# Patient Record
Sex: Female | Born: 1945 | Race: White | Hispanic: No | Marital: Married | Smoking: Never smoker
Health system: Southern US, Community
[De-identification: ages and names within clinical notes are randomized; demographics above are authoritative.]

## PROBLEM LIST (undated history)

## (undated) DIAGNOSIS — J45909 Unspecified asthma, uncomplicated: Secondary | ICD-10-CM

## (undated) DIAGNOSIS — M1990 Unspecified osteoarthritis, unspecified site: Secondary | ICD-10-CM

## (undated) HISTORY — PX: ABDOMINAL HYSTERECTOMY: SHX81

## (undated) HISTORY — DX: Unspecified asthma, uncomplicated: J45.909

---

## 2014-03-24 ENCOUNTER — Emergency Department (HOSPITAL_BASED_OUTPATIENT_CLINIC_OR_DEPARTMENT_OTHER)
Admission: EM | Admit: 2014-03-24 | Discharge: 2014-03-24 | Disposition: A | Discharge status: Home / Self Care | Payer: Medicare Other | Attending: Emergency Medicine | Admitting: Emergency Medicine

## 2014-03-24 ENCOUNTER — Encounter (HOSPITAL_BASED_OUTPATIENT_CLINIC_OR_DEPARTMENT_OTHER): Payer: Self-pay | Admitting: Emergency Medicine

## 2014-03-24 DIAGNOSIS — L509 Urticaria, unspecified: Secondary | ICD-10-CM | POA: Insufficient documentation

## 2014-03-24 HISTORY — DX: Unspecified osteoarthritis, unspecified site: M19.90

## 2014-03-24 MED ORDER — FAMOTIDINE 20 MG PO TABS: ORAL_TABLET | ORAL | Status: DC

## 2014-03-24 MED ORDER — DIPHENHYDRAMINE HCL 25 MG PO TABS: 50.0000 mg | ORAL_TABLET | Freq: Four times a day (QID) | ORAL | Status: DC | PRN

## 2014-03-24 MED ORDER — DEXAMETHASONE SODIUM PHOSPHATE 10 MG/ML IJ SOLN
10.0000 mg | Freq: Once | INTRAMUSCULAR | Status: AC
  Administered 2014-03-24: 10 mg via INTRAVENOUS
  Filled 2014-03-24: qty 1

## 2014-03-24 MED ORDER — FAMOTIDINE IN NACL 20-0.9 MG/50ML-% IV SOLN
20.0000 mg | Freq: Once | INTRAVENOUS | Status: AC
  Administered 2014-03-24: 20 mg via INTRAVENOUS
  Filled 2014-03-24: qty 50

## 2014-03-24 NOTE — ED Notes (Signed)
Pt states she awoke with a "rash" around 0200. Pt denies any new meds, products, or foods. Denies any nausea at present. States she did have nausea earlier. Pt presents with general hives. Denies sob. resp even and unlabored. States she is itching all over.  Has taken 50mg  Benadryl at 0130.

## 2014-03-24 NOTE — ED Provider Notes (Signed)
CSN: [id number]     Arrival date & time 03/24/14  2820 History   First MD Initiated Contact with Patient 03/24/14 [phone]     Chief Complaint  Patient presents with  . Allergic Reaction     (Consider location/radiation/quality/duration/timing/severity/associated sxs/prior Treatment) HPI This is a 68 year old female who developed a generalized urticarial rash about 1:30 this morning. It was preceded by transient nausea but no vomiting. She took 50 mg of Benadryl about 15 minutes later with only equivocal improvement. She has had no throat swelling or shortness of breath. She is not aware of any potential trigger. She describes the rash as very itchy.  History reviewed. No pertinent past medical history. No past surgical history on file. No family history on file. History  Substance Use Topics  . Smoking status: Not on file  . Smokeless tobacco: Not on file  . Alcohol Use: Not on file   OB History   Grav Para Term Preterm Abortions TAB SAB Ect Mult Living                 Review of Systems  All other systems reviewed and are negative.   Allergies  Review of patient's allergies indicates not on file.  Home Medications   Prior to Admission medications   Not on File   BP 164/112  Pulse 94  Temp(Src) 97.4 F (36.3 C) (Oral)  Resp 20  Ht 5\' 1"  (1.549 m)  Wt 155 lb (70.308 kg)  BMI 29.30 kg/m2  SpO2 99%  Physical Exam General: Well-developed, well-nourished female in no acute distress; appearance consistent with age of record HENT: normocephalic; atraumatic; no dysphonia Eyes: pupils equal, round and reactive to light; extraocular muscles intact; lens implants Neck: supple Heart: regular rate and rhythm Lungs: clear to auscultation bilaterally Abdomen: soft; nondistended; nontender; no masses or hepatosplenomegaly; bowel sounds present Extremities: Arthritic changes, notably of the hands; no edema Neurologic: Awake, alert and oriented; motor function intact in all  extremities and symmetric; no facial droop Skin: Warm and dry; generalized urticaria with relative sparing of the face, scalp and lower leg Psychiatric: Normal mood and affect    ED Course  Procedures (including critical care time)  MDM  4:27 AM Symptoms almost completely resolved. Advised to continue Benaryl 50mg  every 6 hours and Pepcid 20mg  every 12 hours as needed for recurrence.    Hanley Seamen, MD 03/24/14 [phone]

## 2015-10-16 ENCOUNTER — Other Ambulatory Visit: Payer: Self-pay | Admitting: *Deleted

## 2015-10-16 ENCOUNTER — Ambulatory Visit (INDEPENDENT_AMBULATORY_CARE_PROVIDER_SITE_OTHER): Payer: Medicare Other | Admitting: Pediatrics

## 2015-10-16 ENCOUNTER — Encounter: Payer: Self-pay | Admitting: Pediatrics

## 2015-10-16 VITALS — BP 130/78 | HR 73 | Temp 97.9°F | Resp 18 | Ht 60.24 in | Wt 157.4 lb

## 2015-10-16 DIAGNOSIS — J22 Unspecified acute lower respiratory infection: Secondary | ICD-10-CM | POA: Insufficient documentation

## 2015-10-16 DIAGNOSIS — J4541 Moderate persistent asthma with (acute) exacerbation: Secondary | ICD-10-CM

## 2015-10-16 DIAGNOSIS — K219 Gastro-esophageal reflux disease without esophagitis: Secondary | ICD-10-CM | POA: Insufficient documentation

## 2015-10-16 DIAGNOSIS — J988 Other specified respiratory disorders: Secondary | ICD-10-CM | POA: Diagnosis not present

## 2015-10-16 DIAGNOSIS — J4530 Mild persistent asthma, uncomplicated: Secondary | ICD-10-CM | POA: Insufficient documentation

## 2015-10-16 MED ORDER — FLOVENT HFA 220 MCG/ACT IN AERO: INHALATION_SPRAY | RESPIRATORY_TRACT | Status: DC

## 2015-10-16 MED ORDER — AZITHROMYCIN 250 MG PO TABS: ORAL_TABLET | ORAL | Status: DC

## 2015-10-16 MED ORDER — IPRATROPIUM-ALBUTEROL 0.5-2.5 (3) MG/3ML IN SOLN
3.0000 mL | Freq: Once | RESPIRATORY_TRACT | Status: AC
  Administered 2015-10-16: 3 mL via RESPIRATORY_TRACT

## 2015-10-16 MED ORDER — ALBUTEROL SULFATE HFA 108 (90 BASE) MCG/ACT IN AERS: 2.0000 | INHALATION_SPRAY | RESPIRATORY_TRACT | Status: DC | PRN

## 2015-10-16 MED ORDER — FLUTICASONE PROPIONATE 50 MCG/ACT NA SUSP: 2.0000 | Freq: Every day | NASAL | Status: DC

## 2015-10-16 NOTE — Patient Instructions (Addendum)
Flovent 2 20-2 puffs twice a day for 2 weeks then decrease to 2 puffs once a day if you're cough or wheeze free Pro-air 2 puffs every 4 hours if needed for wheezing or coughing spells Zithromax 250 mg-2 tablets today, then one tablet once a day for the next 4 days Prednisone 20 mg twice a day for 3 days, 20 mg on the fourth day, 10 mg on the fifth day Call me if you're not doing well on this treatment plan Continue on her other medications

## 2015-10-16 NOTE — Progress Notes (Signed)
[address] [place] [address] Dept: [phone]  FOLLOW UP NOTE  Patient ID: Gina Estrada, female    DOB: [birth year]  Age: 69 y.o. MRN: [id number] Date of Office Visit: 10/16/2015  Assessment Chief Complaint: Cough; Asthma; and Shortness of Breath  HPI Nicollette Wilhelmi presents for evaluation of coughing spells for about 10 days. She has had nasal congestion and a postnasal drainage during this time. Her asthma had been well controlled  Current medications are Flovent 220 one puff once a day, cetirizine 10 mg once a day, pro-air 2 puffs every 4 hours if needed and Patanase 2 sprays per nostril once a day. Her other medications are outlined in the chart.   Drug Allergies:  Allergies  Allergen Reactions  . Penicillins   . Sulfa Antibiotics     Physical Exam: BP 130/78 mmHg  Pulse 73  Temp(Src) 97.9 F (36.6 C) (Oral)  Resp 18  Ht 5' 0.24" (1.53 m)  Wt 157 lb 6.5 oz (71.4 kg)  BMI 30.50 kg/m2   Physical Exam  Constitutional: She is oriented to person, place, and time. She appears well-developed and well-nourished.  HENT:  Eyes normal. Ears normal. Nose normal. Pharynx normal.  Neck: Neck supple.  Cardiovascular:  S1 and S2 normal no murmurs  Pulmonary/Chest:  Clear to percussion auscultation except for mild wheezing in both lungs  Lymphadenopathy:    She has no cervical adenopathy.  Neurological: She is alert and oriented to person, place, and time.  Psychiatric: She has a normal mood and affect. Her behavior is normal. Judgment and thought content normal.  Vitals reviewed.   Diagnostics:   FVC 1.57 L FEV1 1.37 L. Predicted FVC 2.54 L predicted flow 1.92 L-this shows a moderate reduction in the forced vital capacity  Assessment and Plan: 1. Asthma with acute exacerbation, moderate persistent   2. Gastroesophageal reflux disease without esophagitis   3. Lower respiratory tract infection     Meds ordered this encounter  Medications  .  ipratropium-albuterol (DUONEB) 0.5-2.5 (3) MG/3ML nebulizer solution 3 mL    Sig:   . FLOVENT HFA 220 MCG/ACT inhaler    Sig: 2 puffs twice a day 2 weeks then decrease to 2 puffs once daily for cough or wheeze.    Dispense:  1 Inhaler    Refill:  4  . albuterol (PROAIR HFA) 108 (90 Base) MCG/ACT inhaler    Sig: Inhale 2 puffs into the lungs every 4 (four) hours as needed.    Dispense:  1 Inhaler    Refill:  2  . azithromycin (ZITHROMAX) 250 MG tablet    Sig: Take 2 tablets today, then 1 tablet once a day for the next four days.    Dispense:  6 each    Refill:  0  . fluticasone (FLONASE) 50 MCG/ACT nasal spray    Sig: Place 2 sprays into both nostrils daily.    Dispense:  16 g    Refill:  5    Patient Instructions  Flovent 2 20-2 puffs twice a day for 2 weeks then decrease to 2 puffs once a day if you're cough or wheeze free Pro-air 2 puffs every 4 hours if needed for wheezing or coughing spells Zithromax 250 mg-2 tablets today, then one tablet once a day for the next 4 days Prednisone 20 mg twice a day for 3 days, 20 mg on the fourth day, 10 mg on the fifth day Call me if you're not doing well on this treatment plan  Continue on her other medications    Return in about 3 months (around 01/14/2016).    Thank you for the opportunity to care for this patient.  Please do not hesitate to contact me with questions.  Tonette BihariJ. A. Bardelas, M.D.  [facility] [address] [place], [place] [address] [phone]

## 2016-01-13 ENCOUNTER — Ambulatory Visit: Payer: Medicare Other | Admitting: Pediatrics

## 2017-03-30 ENCOUNTER — Encounter: Payer: Self-pay | Admitting: Allergy and Immunology

## 2017-03-30 ENCOUNTER — Ambulatory Visit (INDEPENDENT_AMBULATORY_CARE_PROVIDER_SITE_OTHER): Payer: Medicare HMO | Admitting: Allergy and Immunology

## 2017-03-30 VITALS — BP 124/62 | HR 82 | Temp 97.8°F | Resp 20 | Ht 61.0 in | Wt 163.0 lb

## 2017-03-30 DIAGNOSIS — K219 Gastro-esophageal reflux disease without esophagitis: Secondary | ICD-10-CM

## 2017-03-30 DIAGNOSIS — J45901 Unspecified asthma with (acute) exacerbation: Secondary | ICD-10-CM

## 2017-03-30 DIAGNOSIS — J3089 Other allergic rhinitis: Secondary | ICD-10-CM | POA: Diagnosis not present

## 2017-03-30 MED ORDER — ALBUTEROL SULFATE HFA 108 (90 BASE) MCG/ACT IN AERS: 2.0000 | INHALATION_SPRAY | RESPIRATORY_TRACT | 2 refills | Status: DC | PRN

## 2017-03-30 MED ORDER — RANITIDINE HCL 150 MG PO TABS: 150.0000 mg | ORAL_TABLET | Freq: Two times a day (BID) | ORAL | 3 refills | Status: DC

## 2017-03-30 MED ORDER — FLOVENT HFA 220 MCG/ACT IN AERO: 2.0000 | INHALATION_SPRAY | Freq: Two times a day (BID) | RESPIRATORY_TRACT | 5 refills | Status: DC

## 2017-03-30 MED ORDER — FLUTICASONE PROPIONATE 50 MCG/ACT NA SUSP: 2.0000 | Freq: Every day | NASAL | 5 refills | Status: DC | PRN

## 2017-03-30 NOTE — Progress Notes (Signed)
Follow-up Note  RE: Gina Estrada Gotcher MRN: [id number] DOB: 1946/05/08 Date of Office Visit: 03/30/2017  Primary care provider: Tarri FullerEscajeda, Richard, MD Referring provider: Tarri FullerEscajeda, Richard, MD  History of present illness: Gina Estrada Sisney is a 71 y.o. female with persistent asthma and allergic rhinitis presenting today for a sick visit.  She was last seen in this clinic in December 2016.  She reports that she ran out of Flovent and albuterol because his medications are too expensive.  She has recently been experiencing increased coughing, wheezing, and chest tightness.  She states that over the past 2 days she has been having a "nasty asthma attack" and has been up all night coughing.  She believes that the exacerbation may have been triggered on Saturday night when she had significant acid reflux.  She admits that she is only taking ranitidine sporadically as needed.  She complains of nasal congestion and thick postnasal drainage but denies sinus pressure and ear pressure.  She denies chills and foul mucus production, but is uncertain if she may have had low-grade fever.     Assessment and plan: Asthma with acute exacerbation The history suggests that current exacerbation may have been triggered by laryngopharyngeal reflux.  Prednisone has been provided, 40 mg x3 days, 20 mg x1 day, 10 mg x1 day, then stop.  A prescription has been provided for Flovent 220 g, 2 inhalations via spacer device twice a day.  Some samples have been provided for Asmanex HFA 200 g which she will use in the same way.  A refill prescription has been provided for albuterol HFA, 1-2 inhalations every 4-6 hours as needed.  The patient has been asked to contact me if her symptoms persist or progress. Otherwise, she may return for follow up in 4 months.  Other allergic rhinitis  Continue appropriate allergen avoidance measures.  A prescription has been provided for fluticasone nasal spray, 2 sprays per nostril daily as  needed. Proper nasal spray technique has been discussed and demonstrated.  Nasal saline lavage (NeilMed) has been recommended as needed and prior to medicated nasal sprays along with instructions for proper administration.  For thick post nasal drainage, add guaifenesin [phone] mg (Mucinex)  twice daily as needed with adequate hydration as discussed.  Gastroesophageal reflux disease without esophagitis  I have recommended taking ranitidine (Zantac) 150 mg twice a day rather than as needed.  Appropriate reflux lifestyle modifications have been provided and discussed.   Meds ordered this encounter  Medications  . DISCONTD: fluticasone (FLONASE) 50 MCG/ACT nasal spray    Sig: Place 2 sprays into both nostrils daily as needed for allergies or rhinitis.    Dispense:  16 g    Refill:  5  . DISCONTD: FLOVENT HFA 220 MCG/ACT inhaler    Sig: Inhale 2 puffs into the lungs 2 (two) times daily.    Dispense:  1 Inhaler    Refill:  5    On hold, patient will call.  Marland Kitchen. albuterol (PROAIR HFA) 108 (90 Base) MCG/ACT inhaler    Sig: Inhale 2 puffs into the lungs every 4 (four) hours as needed.    Dispense:  1 Inhaler    Refill:  2  . FLOVENT HFA 220 MCG/ACT inhaler    Sig: Inhale 2 puffs into the lungs 2 (two) times daily.    Dispense:  1 Inhaler    Refill:  5    On hold, patient will call.  . fluticasone (FLONASE) 50 MCG/ACT nasal spray    Sig:  Place 2 sprays into both nostrils daily as needed for allergies or rhinitis.    Dispense:  16 g    Refill:  5  . ranitidine (ZANTAC) 150 MG tablet    Sig: Take 1 tablet (150 mg total) by mouth 2 (two) times daily.    Dispense:  60 tablet    Refill:  3    Diagnostics: Spirometry reveals an FVC of 1.33 L and an FEV1 of 1.21 L (62% predicted) without significant postbronchodilator improvement.  Please see scanned spirometry results for details.    Physical examination: Blood pressure 124/62, pulse 82, temperature 97.8 F (36.6 C), temperature  source Oral, resp. rate 20, height 5\' 1"  (1.549 m), weight 163 lb (73.9 kg), SpO2 96 %.  General: Alert, interactive, in no acute distress. HEENT: TMs pearly gray, turbinates moderately edematous with thick discharge, post-pharynx moderately erythematous. Neck: Supple without lymphadenopathy. Lungs: Mildly decreased breath sounds bilaterally without wheezing, rhonchi or rales. CV: Normal S1, S2 without murmurs. Skin: Warm and dry, without lesions or rashes.  The following portions of the patient's history were reviewed and updated as appropriate: allergies, current medications, past family history, past medical history, past social history, past surgical history and problem list.  Allergies as of 03/30/2017      Reactions   Aspirin Other (See Comments)   Asthma Exacerbations   Penicillins Hives, Itching   Sulfa Antibiotics Hives, Itching   Codeine Nausea And Vomiting      Medication List       Accurate as of 03/30/17  7:36 PM. Always use your most recent med list.          albuterol 108 (90 Base) MCG/ACT inhaler Commonly known as:  PROAIR HFA Inhale 2 puffs into the lungs every 4 (four) hours as needed.   cetirizine 10 MG tablet Commonly known as:  ZYRTEC Take 10 mg by mouth daily.   diphenhydrAMINE 25 MG tablet Commonly known as:  BENADRYL Take 2 tablets (50 mg total) by mouth every 6 (six) hours as needed (for hives).   EPIPEN 2-PAK 0.3 mg/0.3 mL Soaj injection Generic drug:  EPINEPHrine Inject 0.3 mg into the muscle once.   FLOVENT HFA 220 MCG/ACT inhaler Generic drug:  fluticasone Inhale 2 puffs into the lungs 2 (two) times daily.   fluticasone 50 MCG/ACT nasal spray Commonly known as:  FLONASE Place 2 sprays into both nostrils daily as needed for allergies or rhinitis.   ranitidine 150 MG tablet Commonly known as:  ZANTAC Take 1 tablet (150 mg total) by mouth 2 (two) times daily.   risedronate 30 MG tablet Commonly known as:  ACTONEL TAKE 1 TABLET BY MOUTH  EVERY 7 DAYS WITH WATER ON EMPTY STOMACH, DONT LIE DOWN FOR 30 MIN       Allergies  Allergen Reactions  . Aspirin Other (See Comments)    Asthma Exacerbations  . Penicillins Hives and Itching  . Sulfa Antibiotics Hives and Itching  . Codeine Nausea And Vomiting   Review of systems: Review of systems negative except as noted in HPI / PMHx or noted below: Constitutional: Negative.  HENT: Negative.   Eyes: Negative.  Respiratory: Negative.   Cardiovascular: Negative.  Gastrointestinal: Negative.  Genitourinary: Negative.  Musculoskeletal: Negative.  Neurological: Negative.  Endo/Heme/Allergies: Negative.  Cutaneous: Negative.  Past Medical History:  Diagnosis Date  . Arthritis   . Asthma     History reviewed. No pertinent family history.  Social History   Social History  . Marital status:  Married    Spouse name: N/A  . Number of children: N/A  . Years of education: N/A   Occupational History  . Not on file.   Social History Main Topics  . Smoking status: Never Smoker  . Smokeless tobacco: Never Used  . Alcohol use No  . Drug use: No  . Sexual activity: Not on file   Other Topics Concern  . Not on file   Social History Narrative  . No narrative on file    I appreciate the opportunity to take part in [place] care. Please do not hesitate to contact me with questions.  Sincerely,   R. Jorene Guest, MD

## 2017-03-30 NOTE — Patient Instructions (Addendum)
Asthma with acute exacerbation The history suggests that current exacerbation may have been triggered by laryngopharyngeal reflux.  Prednisone has been provided, 40 mg x3 days, 20 mg x1 day, 10 mg x1 day, then stop.  A prescription has been provided for Flovent 220 g, 2 inhalations via spacer device twice a day.  Some samples have been provided for Asmanex HFA 200 g which she will use in the same way.  A refill prescription has been provided for albuterol HFA, 1-2 inhalations every 4-6 hours as needed.  The patient has been asked to contact me if her symptoms persist or progress. Otherwise, she may return for follow up in 4 months.  Other allergic rhinitis  Continue appropriate allergen avoidance measures.  A prescription has been provided for fluticasone nasal spray, 2 sprays per nostril daily as needed. Proper nasal spray technique has been discussed and demonstrated.  Nasal saline lavage (NeilMed) has been recommended as needed and prior to medicated nasal sprays along with instructions for proper administration.  For thick post nasal drainage, add guaifenesin [phone] mg (Mucinex)  twice daily as needed with adequate hydration as discussed.  Gastroesophageal reflux disease without esophagitis  I have recommended taking ranitidine (Zantac) 150 mg twice a day rather than as needed.  Appropriate reflux lifestyle modifications have been provided and discussed.   Return in about 4 months (around 07/30/2017), or if symptoms worsen or fail to improve.  Lifestyle Changes for Controlling GERD  When you have GERD, stomach acid feels as if it's backing up toward your mouth. Whether or not you take medication to control your GERD, your symptoms can often be improved with lifestyle changes.   Raise Your Head  Reflux is more likely to strike when you're lying down flat, because stomach fluid can  flow backward more easily. Raising the head of your bed 4-6 inches can help. To do  this:  Slide blocks or books under the legs at the head of your bed. Or, place a wedge under  the mattress. Many foam stores can make a suitable wedge for you. The wedge  should run from your waist to the top of your head.  Don't just prop your head on several pillows. This increases pressure on your  stomach. It can make GERD worse.  Watch Your Eating Habits Certain foods may increase the acid in your stomach or relax the lower esophageal sphincter, making GERD more likely. It's best to avoid the following:  Coffee, tea, and carbonated drinks (with and without caffeine)  Fatty, fried, or spicy food  Mint, chocolate, onions, and tomatoes  Any other foods that seem to irritate your stomach or cause you pain  Relieve the Pressure  Eat smaller meals, even if you have to eat more often.  Don't lie down right after you eat. Wait a few hours for your stomach to empty.  Avoid tight belts and tight-fitting clothes.  Lose excess weight.  Tobacco and Alcohol  Avoid smoking tobacco and drinking alcohol. They can make GERD symptoms worse.

## 2017-03-30 NOTE — Assessment & Plan Note (Signed)
   Continue appropriate allergen avoidance measures.  A prescription has been provided for fluticasone nasal spray, 2 sprays per nostril daily as needed. Proper nasal spray technique has been discussed and demonstrated.  Nasal saline lavage (NeilMed) has been recommended as needed and prior to medicated nasal sprays along with instructions for proper administration.  For thick post nasal drainage, add guaifenesin [phone] mg (Mucinex)  twice daily as needed with adequate hydration as discussed.

## 2017-03-30 NOTE — Assessment & Plan Note (Addendum)
The history suggests that current exacerbation may have been triggered by laryngopharyngeal reflux.  Prednisone has been provided, 40 mg x3 days, 20 mg x1 day, 10 mg x1 day, then stop.  A prescription has been provided for Flovent 220 g, 2 inhalations via spacer device twice a day.  Some samples have been provided for Asmanex HFA 200 g which she will use in the same way.  A refill prescription has been provided for albuterol HFA, 1-2 inhalations every 4-6 hours as needed.  The patient has been asked to contact me if her symptoms persist or progress. Otherwise, she may return for follow up in 4 months.

## 2017-03-30 NOTE — Assessment & Plan Note (Signed)
   I have recommended taking ranitidine (Zantac) 150 mg twice a day rather than as needed.  Appropriate reflux lifestyle modifications have been provided and discussed.

## 2017-03-31 MED ORDER — FLUTICASONE PROPIONATE 50 MCG/ACT NA SUSP: 2.0000 | Freq: Every day | NASAL | 5 refills | Status: DC | PRN

## 2017-03-31 MED ORDER — FLOVENT HFA 220 MCG/ACT IN AERO: 2.0000 | INHALATION_SPRAY | Freq: Two times a day (BID) | RESPIRATORY_TRACT | 5 refills | Status: DC

## 2017-03-31 NOTE — Progress Notes (Signed)
Medications have been sent to pharmacy again 

## 2017-03-31 NOTE — Progress Notes (Signed)
Medications have been sent to pharmacy again

## 2017-03-31 NOTE — Addendum Note (Signed)
Addended by: Mliss FritzBLACK, Lilibeth Opie I on: 03/31/2017 08:04 AM   Modules accepted: Orders

## 2017-07-20 ENCOUNTER — Telehealth: Payer: Self-pay | Admitting: Allergy and Immunology

## 2017-07-20 DIAGNOSIS — K219 Gastro-esophageal reflux disease without esophagitis: Secondary | ICD-10-CM

## 2017-07-20 MED ORDER — RANITIDINE HCL 150 MG PO TABS: 150.0000 mg | ORAL_TABLET | Freq: Two times a day (BID) | ORAL | 0 refills | Status: DC

## 2017-07-20 NOTE — Telephone Encounter (Signed)
Received fax for a 90 day supply of Ranitidine. Patient was last seen 03/30/2017 and has an Office visit on 08/03/2017. Refills were sent in.

## 2017-07-28 ENCOUNTER — Other Ambulatory Visit: Payer: Self-pay | Admitting: Allergy and Immunology

## 2017-07-28 DIAGNOSIS — K219 Gastro-esophageal reflux disease without esophagitis: Secondary | ICD-10-CM

## 2017-07-28 NOTE — Telephone Encounter (Signed)
Received fax for 90 day supply for Ranitidine. Patient was last seen on 03/30/2017. Request was sent in on 07/20/2017. Request was denied.

## 2017-08-03 ENCOUNTER — Ambulatory Visit (INDEPENDENT_AMBULATORY_CARE_PROVIDER_SITE_OTHER): Payer: Medicare HMO | Admitting: Allergy and Immunology

## 2017-08-03 ENCOUNTER — Telehealth: Payer: Self-pay | Admitting: Allergy and Immunology

## 2017-08-03 ENCOUNTER — Encounter: Payer: Self-pay | Admitting: Allergy and Immunology

## 2017-08-03 VITALS — BP 156/68 | HR 79 | Temp 97.8°F | Resp 20

## 2017-08-03 DIAGNOSIS — J3089 Other allergic rhinitis: Secondary | ICD-10-CM

## 2017-08-03 DIAGNOSIS — J4530 Mild persistent asthma, uncomplicated: Secondary | ICD-10-CM | POA: Diagnosis not present

## 2017-08-03 MED ORDER — MOMETASONE FUROATE 200 MCG/ACT IN AERO: 2.0000 | INHALATION_SPRAY | Freq: Two times a day (BID) | RESPIRATORY_TRACT | 5 refills | Status: DC

## 2017-08-03 MED ORDER — FLUTICASONE PROPIONATE HFA 220 MCG/ACT IN AERO: 2.0000 | INHALATION_SPRAY | Freq: Two times a day (BID) | RESPIRATORY_TRACT | 5 refills | Status: DC

## 2017-08-03 MED ORDER — TRIAMCINOLONE ACETONIDE 55 MCG/ACT NA AERO: 2.0000 | INHALATION_SPRAY | Freq: Every day | NASAL | 6 refills | Status: DC

## 2017-08-03 NOTE — Patient Instructions (Signed)
Mild persistent asthma Currently well controlled.  As we have no samples of Flovent, samples and a prescription have been provided for Asmanex 200 g, 2 inhalations via spacer device twice a day.  Continue albuterol HFA, 1-2 inhalations every 4-6 hours if needed.  If subjective and objective measures of pulmonary function remain stable, we will consider stepping down therapy on the next visit.  Allergic rhinitis Stable.  Continue appropriate allergen avoidance measures.  Discontinue fluticasone nasal spray due to perceived side effects.  Samples and a prescription have been provided for Nasacort AQ nasal spray, 2 sprays per nostril daily as needed. Proper nasal spray technique has been discussed and demonstrated.  Nasal saline spray (i.e., Simply Saline) or nasal saline lavage (i.e., NeilMed) as needed and prior to medicated nasal sprays.   Return in about 5 months (around 01/01/2018), or if symptoms worsen or fail to improve.

## 2017-08-03 NOTE — Progress Notes (Signed)
Follow-up Note  RE: Gina Estrada MRN: [id number] DOB: [birth year] Date of Office Visit: 08/03/2017  Primary care provider: Tarri FullerEscajeda, Richard, MD Referring provider: Tarri FullerEscajeda, Richard, MD  History of present illness: Gina Estrada is a 71 y.o. female with persistent asthma and allergic rhinitis presenting today for follow up.  She was last seen in this clinic on 03/30/2017 with an asthma exacerbation.  The exacerbation resolved properly with prednisone.  Since that time, her asthma has been well controlled with Flovent 200 point g, 2 inhalations via spacer device twice a day.  She rarely requires albuterol rescue and denies nocturnal awakenings due to lower respiratory symptoms.  She is requesting a prescription for nasal spray other than fluticasone nasal spray, as the flowery scent makes her feel sick if it drains down the back of her throat.  She has no nasal symptom complaints today.   Assessment and plan: Mild persistent asthma Currently well controlled.  As we have no samples of Flovent, samples and a prescription have been provided for Asmanex 200 g, 2 inhalations via spacer device twice a day.  Continue albuterol HFA, 1-2 inhalations every 4-6 hours if needed.  If subjective and objective measures of pulmonary function remain stable, we will consider stepping down therapy on the next visit.  Allergic rhinitis Stable.  Continue appropriate allergen avoidance measures.  Discontinue fluticasone nasal spray due to perceived side effects.  Samples and a prescription have been provided for Nasacort AQ nasal spray, 2 sprays per nostril daily as needed. Proper nasal spray technique has been discussed and demonstrated.  Nasal saline spray (i.e., Simply Saline) or nasal saline lavage (i.e., NeilMed) as needed and prior to medicated nasal sprays.   Meds ordered this encounter  Medications  . Mometasone Furoate (ASMANEX HFA) 200 MCG/ACT AERO    Sig: Inhale 2 puffs into the lungs 2  (two) times daily.    Dispense:  13 g    Refill:  5  . triamcinolone (NASACORT) 55 MCG/ACT AERO nasal inhaler    Sig: Place 2 sprays into the nose daily.    Dispense:  1 Inhaler    Refill:  6    Diagnostics: Spirometry reveals an FVC of 123 L and an FEV1 of 1.57 L (80% predicted) with an FEV1 ratio 113%.   Please see scanned spirometry results for details.    Physical examination: Blood pressure (!) 156/68, pulse 79, temperature 97.8 F (36.6 C), temperature source Oral, resp. rate 20, SpO2 96 %.  General: Alert, interactive, in no acute distress. HEENT: TMs pearly gray, turbinates mildly edematous without discharge, post-pharynx unremarkable. Neck: Supple without lymphadenopathy. Lungs: Clear to auscultation without wheezing, rhonchi or rales. CV: Normal S1, S2 without murmurs. Skin: Warm and dry, without lesions or rashes.  The following portions of the patient's history were reviewed and updated as appropriate: allergies, current medications, past family history, past medical history, past social history, past surgical history and problem list.  Allergies as of 08/03/2017      Reactions   Aspirin Other (See Comments)   Asthma Exacerbations   Penicillins Hives, Itching   Sulfa Antibiotics Hives, Itching   Codeine Nausea And Vomiting      Medication List       Accurate as of 08/03/17 12:59 PM. Always use your most recent med list.          albuterol 108 (90 Base) MCG/ACT inhaler Commonly known as:  PROAIR HFA Inhale 2 puffs into the lungs every 4 (four) hours as  needed.   cetirizine 10 MG tablet Commonly known as:  ZYRTEC Take 10 mg by mouth daily.   diphenhydrAMINE 25 MG tablet Commonly known as:  BENADRYL Take 2 tablets (50 mg total) by mouth every 6 (six) hours as needed (for hives).   EPIPEN 2-PAK 0.3 mg/0.3 mL Soaj injection Generic drug:  EPINEPHrine Inject 0.3 mg into the muscle once.   FLOVENT HFA 220 MCG/ACT inhaler Generic drug:   fluticasone Inhale 2 puffs into the lungs 2 (two) times daily.   fluticasone 50 MCG/ACT nasal spray Commonly known as:  FLONASE Place 2 sprays into both nostrils daily as needed for allergies or rhinitis.   Mometasone Furoate 200 MCG/ACT Aero Commonly known as:  ASMANEX HFA Inhale 2 puffs into the lungs 2 (two) times daily.   ranitidine 150 MG tablet Commonly known as:  ZANTAC Take 1 tablet (150 mg total) by mouth 2 (two) times daily.   risedronate 30 MG tablet Commonly known as:  ACTONEL TAKE 1 TABLET BY MOUTH EVERY 7 DAYS WITH WATER ON EMPTY STOMACH, DONT LIE DOWN FOR 30 MIN   triamcinolone 55 MCG/ACT Aero nasal inhaler Commonly known as:  NASACORT Place 2 sprays into the nose daily.       Allergies  Allergen Reactions  . Aspirin Other (See Comments)    Asthma Exacerbations  . Penicillins Hives and Itching  . Sulfa Antibiotics Hives and Itching  . Codeine Nausea And Vomiting    I appreciate the opportunity to take part in [name]'s care. Please do not hesitate to contact me with questions.  Sincerely,   R. Jorene Guest, MD

## 2017-08-03 NOTE — Assessment & Plan Note (Signed)
Currently well controlled.  As we have no samples of Flovent, samples and a prescription have been provided for Asmanex 200 g, 2 inhalations via spacer device twice a day.  Continue albuterol HFA, 1-2 inhalations every 4-6 hours if needed.  If subjective and objective measures of pulmonary function remain stable, we will consider stepping down therapy on the next visit.

## 2017-08-03 NOTE — Telephone Encounter (Signed)
Received a fax stating that Asmanex HFA was not cover by insurance and would like an alternative. Please advise

## 2017-08-03 NOTE — Telephone Encounter (Signed)
Flovent 220 mcg

## 2017-08-03 NOTE — Assessment & Plan Note (Signed)
Stable.  Continue appropriate allergen avoidance measures.  Discontinue fluticasone nasal spray due to perceived side effects.  Samples and a prescription have been provided for Nasacort AQ nasal spray, 2 sprays per nostril daily as needed. Proper nasal spray technique has been discussed and demonstrated.  Nasal saline spray (i.e., Simply Saline) or nasal saline lavage (i.e., NeilMed) as needed and prior to medicated nasal sprays.

## 2017-08-03 NOTE — Telephone Encounter (Signed)
Called and left message for patient to call office in regards to medication change from Asmanex to Flovent 220.

## 2017-08-03 NOTE — Addendum Note (Signed)
Addended by: Dub MikesHICKS, Gennie Dib N on: 08/03/2017 03:09 PM   Modules accepted: Orders

## 2017-08-05 NOTE — Telephone Encounter (Signed)
Left message to return call regarding medication change.

## 2017-08-10 NOTE — Telephone Encounter (Signed)
I called patient again and left detailed message explaining med change and reason and how to use the flovent inhaler.

## 2018-01-05 ENCOUNTER — Ambulatory Visit: Payer: Medicare HMO | Admitting: Allergy and Immunology

## 2018-03-09 ENCOUNTER — Ambulatory Visit: Payer: Medicare HMO | Admitting: Allergy and Immunology

## 2018-03-16 ENCOUNTER — Ambulatory Visit: Payer: Medicare HMO | Admitting: Family Medicine

## 2018-03-16 ENCOUNTER — Encounter: Payer: Self-pay | Admitting: Family Medicine

## 2018-03-16 VITALS — BP 130/62 | HR 80 | Temp 97.6°F | Resp 16

## 2018-03-16 DIAGNOSIS — L299 Pruritus, unspecified: Secondary | ICD-10-CM | POA: Diagnosis not present

## 2018-03-16 DIAGNOSIS — J4530 Mild persistent asthma, uncomplicated: Secondary | ICD-10-CM

## 2018-03-16 DIAGNOSIS — T781XXS Other adverse food reactions, not elsewhere classified, sequela: Secondary | ICD-10-CM | POA: Diagnosis not present

## 2018-03-16 DIAGNOSIS — T781XXA Other adverse food reactions, not elsewhere classified, initial encounter: Secondary | ICD-10-CM | POA: Insufficient documentation

## 2018-03-16 DIAGNOSIS — J3089 Other allergic rhinitis: Secondary | ICD-10-CM | POA: Diagnosis not present

## 2018-03-16 MED ORDER — RANITIDINE HCL 150 MG PO TABS: 150.0000 mg | ORAL_TABLET | Freq: Two times a day (BID) | ORAL | 5 refills | Status: DC

## 2018-03-16 MED ORDER — CETIRIZINE HCL 10 MG PO TABS: 10.0000 mg | ORAL_TABLET | Freq: Every day | ORAL | 5 refills | Status: DC

## 2018-03-16 MED ORDER — RANITIDINE HCL 150 MG PO TABS: 150.0000 mg | ORAL_TABLET | Freq: Two times a day (BID) | ORAL | 0 refills | Status: DC

## 2018-03-16 MED ORDER — EPINEPHRINE 0.3 MG/0.3ML IJ SOAJ: 0.3000 mg | Freq: Once | INTRAMUSCULAR | 0 refills | Status: AC

## 2018-03-16 MED ORDER — TRIAMCINOLONE ACETONIDE 55 MCG/ACT NA AERO: 2.0000 | INHALATION_SPRAY | Freq: Every day | NASAL | 6 refills | Status: DC

## 2018-03-16 MED ORDER — MOMETASONE FUROATE 200 MCG/ACT IN AERO: 2.0000 | INHALATION_SPRAY | Freq: Two times a day (BID) | RESPIRATORY_TRACT | 5 refills | Status: DC

## 2018-03-16 NOTE — Progress Notes (Addendum)
[address] [place] [address] Dept: [phone]  FOLLOW UP NOTE  Patient ID: Demitra Danley, female    DOB: Sep 07, 1946  Age: 72 y.o. MRN: [id number] Date of Office Visit: 03/16/2018  Assessment  Chief Complaint: Asthma  HPI Gina Estrada is a 72 year old female who presents to the clinic for a follow up visit. She was last seen in this clinic on 08/03/2017 by Dr. Nunzio Cobbs for evaluation of persistent asthma and allergic rhinitis. At that time, her asthma was well controlled with Flovent 220 and she rarely needed her albuterol inhaler. Allergic rhinitis was well controlled and she was switched to Nasacort AQ due to intolerance of Flonase.   In the interim, she was switched from Flovent to Asmanex 200 HFA due to affordability.  At today's visit, she reports she has done well overall. Caprisha's asthma has been well controlled. She has not required rescue medication, experienced nocturnal awakenings due to lower respiratory symptoms, nor have activities of daily living been limited. She has required no Emergency Department or Urgent Care visits for her asthma. She has required zero courses of systemic steroids for asthma exacerbations since the last visit. ACT score today is 23, indicating excellent asthma symptom control. She continues to use Asmanex 200 HFA with a spacer device and rarely uses her albuterol inhaler.   Allergic rhinitis is reported as well controlled with only occasional nasal congestion which is aggravated by weather changes. She is not currently using any medical intervention.   She reports that when she eats certain foods that have not yet been identified, she feels tingling on her skin. At the first sign of tingling she takes cetirizine and ranitidine with resolution of the tingling. She denies angioedema, cardiopulmonary, or gastrointestinal symptoms. She reports that her EpiPen has recently expired.  She reports red, itchy areas of skin on her back and one on her  thigh. She has not tried any intervention for these areas.    Drug Allergies:  Allergies  Allergen Reactions  . Aspirin Other (See Comments)    Asthma Exacerbations  . Penicillins Hives and Itching  . Sulfa Antibiotics Hives and Itching  . Codeine Nausea And Vomiting    Physical Exam: BP 130/62   Pulse 80   Temp 97.6 F (36.4 C) (Oral)   Resp 16   SpO2 95%    Physical Exam  Constitutional: She is oriented to person, place, and time. She appears well-developed and well-nourished.  HENT:  Head: Normocephalic.  Right Ear: External ear normal.  Left Ear: External ear normal.  Bilateral nares slightly erythematous and edematous with clear nasal drainage noted. Pharynx slightly erythematous. No exudate noted.   Eyes: Conjunctivae are normal.  Neck: Normal range of motion. Neck supple.  Cardiovascular: Normal rate, regular rhythm and normal heart sounds.  No murmur noted.  Pulmonary/Chest: Effort normal and breath sounds normal.  Lungs clear to ausciultation  Musculoskeletal: Normal range of motion.  Neurological: She is alert and oriented to person, place, and time.  Skin: Skin is warm and dry.  Small erythematous dry area mid back. No open areas and no drainage noted.   Psychiatric: She has a normal mood and affect. Her behavior is normal. Judgment and thought content normal.    Diagnostics: FVC 1.84, FEV1 1.54. Predicted FVC 2.57, predicted FEV1 1.93. Spirometry indicates mild restriction. This is consistent with previous spirometry reading.    Assessment and Plan: 1. Mild persistent asthma without complication   2. Allergic rhinitis  3. Itch of skin   4. Adverse food reaction, sequela     Meds ordered this encounter  Medications  . cetirizine (ZYRTEC) 10 MG tablet    Sig: Take 1 tablet (10 mg total) by mouth daily.    Dispense:  30 tablet    Refill:  5  . EPINEPHrine (EPIPEN 2-PAK) 0.3 mg/0.3 mL IJ SOAJ injection    Sig: Inject 0.3 mLs (0.3 mg total) into the  muscle once for 1 dose.    Dispense:  0.3 mL    Refill:  0  . Mometasone Furoate (ASMANEX HFA) 200 MCG/ACT AERO    Sig: Inhale 2 puffs into the lungs 2 (two) times daily.    Dispense:  13 g    Refill:  5  . DISCONTD: ranitidine (ZANTAC) 150 MG tablet    Sig: Take 1 tablet (150 mg total) by mouth 2 (two) times daily.    Dispense:  180 tablet    Refill:  0  . triamcinolone (NASACORT) 55 MCG/ACT AERO nasal inhaler    Sig: Place 2 sprays into the nose daily.    Dispense:  1 Inhaler    Refill:  6  . ranitidine (ZANTAC) 150 MG tablet    Sig: Take 1 tablet (150 mg total) by mouth 2 (two) times daily.    Dispense:  60 tablet    Refill:  5    Patient Instructions  Mild persistent asthma Currently well controlled.  Continue Asmanex 200 g, 2 inhalations via spacer device twice a day to prevent cough or wheeze.  Continue albuterol HFA, 2 inhalations every 4 hours if needed for cough or wheeze   Allergic rhinitis Stable.  Continue appropriate allergen avoidance measures.  Continue Nasacort AQ nasal spray, 2 sprays per nostril daily as needed. Proper nasal spray technique has been discussed and demonstrated.  Consider nasal saline spray prior to medicated nasal sprays  Pruritis Apply Eucrisa twice a day as needed to red itchy areas. Call the office if this is not effective.   Food allergy Begin to keep a journal of foods that cause you to develop hives or itch. At the first sign of itching you may take Zyrtec and Zantac In case of an allergic reaction, take Benadryl 50 mg every 4 hours, and life-threatening symptoms occur, inject with EpiPen 0.3 mg.   Call me if this plan is not working well for you  Continue the other medications as listed in your chart  Return in about 6 months or sooner if needed    Return in about 6 months (around 09/16/2018), or if symptoms worsen or fail to improve.    Thank you for the opportunity to care for this patient.  Please do not hesitate  to contact me with questions.  Thermon Leyland, FNP [facility]   I have provided oversight concerning [facility]'s evaluation and treatment of this patient's health issues addressed during today's encounter.  I agree with the assessment and therapeutic plan as outlined in the note.   Signed,   R Jorene Guest, MD

## 2018-03-16 NOTE — Patient Instructions (Addendum)
Mild persistent asthma Currently well controlled.  Continue Asmanex 200 g, 2 inhalations via spacer device twice a day to prevent cough or wheeze.  Continue albuterol HFA, 2 inhalations every 4 hours if needed for cough or wheeze   Allergic rhinitis Stable.  Continue appropriate allergen avoidance measures.  Continue Nasacort AQ nasal spray, 2 sprays per nostril daily as needed. Proper nasal spray technique has been discussed and demonstrated.  Consider nasal saline spray prior to medicated nasal sprays  Pruritis  Apply Eucrisa twice a day as needed to red itchy areas. Call the office if this is not effective.   Food allergy  Begin to keep a journal of foods that cause you to develop hives or itch.  At the first sign of itching you may take Zyrtec and Zantac  In case of an allergic reaction, take Benadryl 50 mg every 4 hours, and life-threatening symptoms occur, inject with EpiPen 0.3 mg.   Call me if this plan is not working well for you  Continue the other medications as listed in your chart  Return in about 6 months or sooner if needed

## 2020-02-25 ENCOUNTER — Telehealth: Payer: Self-pay | Admitting: Physician Assistant

## 2020-02-25 ENCOUNTER — Emergency Department (HOSPITAL_BASED_OUTPATIENT_CLINIC_OR_DEPARTMENT_OTHER): Payer: Medicare HMO

## 2020-02-25 ENCOUNTER — Encounter (HOSPITAL_BASED_OUTPATIENT_CLINIC_OR_DEPARTMENT_OTHER): Payer: Self-pay | Admitting: *Deleted

## 2020-02-25 ENCOUNTER — Emergency Department (HOSPITAL_BASED_OUTPATIENT_CLINIC_OR_DEPARTMENT_OTHER)
Admission: EM | Admit: 2020-02-25 | Discharge: 2020-02-25 | Disposition: A | Discharge status: Home / Self Care | Payer: Medicare HMO | Attending: Emergency Medicine | Admitting: Emergency Medicine

## 2020-02-25 ENCOUNTER — Other Ambulatory Visit: Payer: Self-pay

## 2020-02-25 DIAGNOSIS — U071 COVID-19: Secondary | ICD-10-CM | POA: Diagnosis not present

## 2020-02-25 DIAGNOSIS — R05 Cough: Secondary | ICD-10-CM | POA: Insufficient documentation

## 2020-02-25 DIAGNOSIS — Z79899 Other long term (current) drug therapy: Secondary | ICD-10-CM | POA: Diagnosis not present

## 2020-02-25 DIAGNOSIS — R110 Nausea: Secondary | ICD-10-CM

## 2020-02-25 DIAGNOSIS — M7918 Myalgia, other site: Secondary | ICD-10-CM | POA: Diagnosis not present

## 2020-02-25 DIAGNOSIS — J45909 Unspecified asthma, uncomplicated: Secondary | ICD-10-CM | POA: Insufficient documentation

## 2020-02-25 LAB — LIPASE, BLOOD: Lipase: 39 U/L (ref 11–51)

## 2020-02-25 LAB — SARS CORONAVIRUS 2 AG (30 MIN TAT): SARS Coronavirus 2 Ag: POSITIVE — AB

## 2020-02-25 LAB — COMPREHENSIVE METABOLIC PANEL
ALT: 36 U/L (ref 0–44)
AST: 38 U/L (ref 15–41)
Albumin: 3.7 g/dL (ref 3.5–5.0)
Alkaline Phosphatase: 98 U/L (ref 38–126)
Anion gap: 11 (ref 5–15)
BUN: 14 mg/dL (ref 8–23)
CO2: 24 mmol/L (ref 22–32)
Calcium: 8.5 mg/dL — ABNORMAL LOW (ref 8.9–10.3)
Chloride: 104 mmol/L (ref 98–111)
Creatinine, Ser: 1.15 mg/dL — ABNORMAL HIGH (ref 0.44–1.00)
GFR calc Af Amer: 54 mL/min — ABNORMAL LOW (ref >60)
GFR calc non Af Amer: 47 mL/min — ABNORMAL LOW (ref >60)
Glucose, Bld: 97 mg/dL (ref 70–99)
Potassium: 3.9 mmol/L (ref 3.5–5.1)
Sodium: 139 mmol/L (ref 135–145)
Total Bilirubin: 0.8 mg/dL (ref 0.3–1.2)
Total Protein: 7.3 g/dL (ref 6.5–8.1)

## 2020-02-25 LAB — CBC WITH DIFFERENTIAL/PLATELET
Abs Immature Granulocytes: 0.03 10*3/uL (ref 0.00–0.07)
Basophils Absolute: 0 10*3/uL (ref 0.0–0.1)
Basophils Relative: 0 %
Eosinophils Absolute: 0 10*3/uL (ref 0.0–0.5)
Eosinophils Relative: 0 %
HCT: 40.4 % (ref 36.0–46.0)
Hemoglobin: 12.8 g/dL (ref 12.0–15.0)
Immature Granulocytes: 0 %
Lymphocytes Relative: 18 %
Lymphs Abs: 1.3 10*3/uL (ref 0.7–4.0)
MCH: 28.4 pg (ref 26.0–34.0)
MCHC: 31.7 g/dL (ref 30.0–36.0)
MCV: 89.6 fL (ref 80.0–100.0)
Monocytes Absolute: 0.6 10*3/uL (ref 0.1–1.0)
Monocytes Relative: 8 %
Neutro Abs: 5.1 10*3/uL (ref 1.7–7.7)
Neutrophils Relative %: 74 %
Platelets: 349 10*3/uL (ref 150–400)
RBC: 4.51 MIL/uL (ref 3.87–5.11)
RDW: 13.8 % (ref 11.5–15.5)
WBC: 7 10*3/uL (ref 4.0–10.5)
nRBC: 0 % (ref 0.0–0.2)

## 2020-02-25 MED ORDER — METOCLOPRAMIDE HCL 5 MG/ML IJ SOLN
10.0000 mg | INTRAMUSCULAR | Status: AC
  Administered 2020-02-25: 10 mg via INTRAVENOUS
  Filled 2020-02-25: qty 2

## 2020-02-25 MED ORDER — METOCLOPRAMIDE HCL 10 MG PO TABS
10.0000 mg | ORAL_TABLET | Freq: Three times a day (TID) | ORAL | 0 refills | Status: DC | PRN
Start: 2020-02-25 — End: 2022-10-04

## 2020-02-25 MED ORDER — LACTATED RINGERS IV BOLUS
1000.0000 mL | Freq: Once | INTRAVENOUS | Status: AC
  Administered 2020-02-25: 1000 mL via INTRAVENOUS

## 2020-02-25 NOTE — ED Triage Notes (Signed)
Aching all over, has been very nauseated, unable to eat, onset approx 1 week

## 2020-02-25 NOTE — ED Provider Notes (Signed)
[facility] EMERGENCY DEPARTMENT Provider Note   CSN: [id number] Arrival date & time: 02/25/20  7893     History Chief Complaint  Patient presents with  . Nausea    Gina Estrada is a 74 y.o. female.  74yo F w/ PMH including asthma, arthritis who p/w body aches, nausea, cough. Husband was diagnosed w/ COVID-19 3 days ago. Pt has been sick for 1 week with nausea, cough, body aches, intermittent fevers up to 101 1 week ago, and some shortness of breath. She had diarrhea this morning. No vomiting. She has been taking tylenol and ibuprofen. No urinary symptoms. Some chest pain only with coughing. She felt some wheezing in the beginning but this has resolved.  The history is provided by the patient.       Past Medical History:  Diagnosis Date  . Arthritis   . Asthma     Patient Active Problem List   Diagnosis Date Noted  . Itch of skin 03/16/2018  . Adverse food reaction 03/16/2018  . Allergic rhinitis 03/30/2017  . Mild persistent asthma 10/16/2015  . Gastroesophageal reflux disease without esophagitis 10/16/2015  . Lower respiratory tract infection 10/16/2015    Past Surgical History:  Procedure Laterality Date  . CESAREAN SECTION       OB History   No obstetric history on file.     Family History  Problem Relation Age of Onset  . Allergic rhinitis Neg Hx   . Angioedema Neg Hx   . Asthma Neg Hx   . Eczema Neg Hx   . Immunodeficiency Neg Hx   . Urticaria Neg Hx     Social History   Tobacco Use  . Smoking status: Never Smoker  . Smokeless tobacco: Never Used  Substance Use Topics  . Alcohol use: No  . Drug use: No    Home Medications Prior to Admission medications   Medication Sig Start Date End Date Taking? Authorizing Provider  albuterol (PROAIR HFA) 108 (90 Base) MCG/ACT inhaler Inhale 2 puffs into the lungs every 4 (four) hours as needed. 03/30/17   Bobbitt, Heywood Iles, MD  cetirizine (ZYRTEC) 10 MG tablet Take 1 tablet (10 mg total)  by mouth daily. 03/16/18   Ambs, Norvel Richards, FNP  diphenhydrAMINE (BENADRYL) 25 MG tablet Take 2 tablets (50 mg total) by mouth every 6 (six) hours as needed (for hives). 03/24/14   Molpus, John, MD  fluticasone (FLONASE) 50 MCG/ACT nasal spray Place 2 sprays into both nostrils daily as needed for allergies or rhinitis. Patient not taking: Reported on 08/03/2017 03/31/17   Bobbitt, Heywood Iles, MD  fluticasone (FLOVENT HFA) 220 MCG/ACT inhaler Inhale 2 puffs into the lungs 2 (two) times daily. 08/03/17   Bobbitt, Heywood Iles, MD  metoCLOPramide (REGLAN) 10 MG tablet Take 1 tablet (10 mg total) by mouth every 8 (eight) hours as needed for nausea or vomiting. 02/25/20   Ridwan Bondy, Ambrose Finland, MD  Mometasone Furoate [facility] HFA) 200 MCG/ACT AERO Inhale 2 puffs into the lungs 2 (two) times daily. 03/16/18   Hetty Blend, FNP  ranitidine (ZANTAC) 150 MG tablet Take 1 tablet (150 mg total) by mouth 2 (two) times daily. 03/16/18   Ambs, Norvel Richards, FNP  risedronate (ACTONEL) 30 MG tablet TAKE 1 TABLET BY MOUTH EVERY 7 DAYS WITH WATER ON EMPTY STOMACH, DONT LIE DOWN FOR 30 MIN 03/25/17   [provider]  triamcinolone (NASACORT) 55 MCG/ACT AERO nasal inhaler Place 2 sprays into the nose daily. 03/16/18  Dara Hoyer, FNP    Allergies    Aspirin, Penicillins, Sulfa antibiotics, and Codeine  Review of Systems   Review of Systems All other systems reviewed and are negative except that which was mentioned in HPI  Physical Exam Updated Vital Signs BP (!) 141/74 (BP Location: Left Arm)   Pulse 85   Temp 98.5 F (36.9 C) (Oral)   Resp 18   Ht 5\' 1"  (1.549 m)   Wt 68 kg   SpO2 96%   BMI 28.34 kg/m   Physical Exam Vitals and nursing note reviewed.  Constitutional:      General: She is not in acute distress.    Appearance: She is well-developed.  HENT:     Head: Normocephalic and atraumatic.     Mouth/Throat:     Mouth: Mucous membranes are moist.     Pharynx: Oropharynx is clear.  Eyes:      Conjunctiva/sclera: Conjunctivae normal.  Cardiovascular:     Rate and Rhythm: Normal rate and regular rhythm.     Heart sounds: Normal heart sounds. No murmur.  Pulmonary:     Effort: Pulmonary effort is normal.     Breath sounds: Normal breath sounds.     Comments: Frequent dry cough Abdominal:     General: Bowel sounds are normal. There is no distension.     Palpations: Abdomen is soft.     Tenderness: There is no abdominal tenderness.  Musculoskeletal:     Cervical back: Neck supple.  Skin:    General: Skin is warm and dry.  Neurological:     Mental Status: She is alert and oriented to person, place, and time.     Sensory: No sensory deficit.     Motor: No weakness.     Comments: Fluent speech  Psychiatric:        Judgment: Judgment normal.     ED Results / Procedures / Treatments   Labs (all labs ordered are listed, but only abnormal results are displayed) Labs Reviewed  SARS CORONAVIRUS 2 AG (30 MIN TAT) - Abnormal; Notable for the following components:      Result Value   SARS Coronavirus 2 Ag POSITIVE (*)    All other components within normal limits  COMPREHENSIVE METABOLIC PANEL - Abnormal; Notable for the following components:   Creatinine, Ser 1.15 (*)    Calcium 8.5 (*)    GFR calc non Af Amer 47 (*)    GFR calc Af Amer 54 (*)    All other components within normal limits  CBC WITH DIFFERENTIAL/PLATELET  LIPASE, BLOOD    EKG None  Radiology DG Chest Port 1 View  Result Date: 02/25/2020 CLINICAL DATA:  Aching, nausea and loss of appetite for 1 week. EXAM: PORTABLE CHEST 1 VIEW COMPARISON:  None. FINDINGS: Lungs are clear. Heart size is normal. No pneumothorax or pleural effusion. Aortic atherosclerosis. No acute or focal bony abnormality. IMPRESSION: No acute disease. Atherosclerosis. Electronically Signed   By: Inge Rise M.D.   On: 02/25/2020 10:46    Procedures Procedures (including critical care time)  Medications Ordered in ED Medications   lactated ringers bolus 1,000 mL (0 mLs Intravenous Stopped 02/25/20 1228)  metoCLOPramide (REGLAN) injection 10 mg (10 mg Intravenous Given 02/25/20 1107)    ED Course  I have reviewed the triage vital signs and the nursing notes.  Pertinent labs & imaging results that were available during my care of the patient were reviewed by me and considered in my medical  decision making (see chart for details).    MDM Rules/Calculators/A&P                      Non-toxic on exam, reassuring VS. O2 sat normal on RA. CXR clear. COVID-19 is positive, which is c/w symptoms. Her labs show Cr 1.15, normal electrolytes, normal LFTS and CBC. I am reassured by her well appearance, normal breathing, and clear CXR. Gave IVF bolus and reglan for nausea.   On reassessment, nausea much improved and pt tolerating PO.  Discussed supportive measures and extensively reviewed return precautions particularly regarding worsening breathing or dehydration. She voiced understanding. I have called monoclonal antibody clinic to contact patient for assessment/treatment.  Vita Currin was evaluated in Emergency Department on 02/25/2020 for the symptoms described in the history of present illness. She was evaluated in the context of the global COVID-19 pandemic, which necessitated consideration that the patient might be at risk for infection with the SARS-CoV-2 virus that causes COVID-19. Institutional protocols and algorithms that pertain to the evaluation of patients at risk for COVID-19 are in a state of rapid change based on information released by regulatory bodies including the CDC and federal and state organizations. These policies and algorithms were followed during the patient's care in the ED.  Final Clinical Impression(s) / ED Diagnoses Final diagnoses:  COVID-19 virus infection  Nausea    Rx / DC Orders ED Discharge Orders         Ordered    metoCLOPramide (REGLAN) 10 MG tablet  Every 8 hours PRN     02/25/20 1308            Trig Mcbryar, Ambrose Finland, MD 02/25/20 1308

## 2020-02-25 NOTE — Telephone Encounter (Signed)
Called to discuss with Gina Estrada about Covid symptoms and the use of bamlanivimab/etesevimab or casirivimab/imdevimab, a monoclonal antibody infusion for those with mild to moderate Covid symptoms and at a high risk of hospitalization.    Pt does not qualify for infusion therapy as pt's symptoms first presented > 10 days prior to timing of infusion. Symptoms tier reviewed as well as criteria for ending isolation. Preventative practices reviewed. Patient verbalized understanding      Patient Active Problem List   Diagnosis Date Noted  . Itch of skin 03/16/2018  . Adverse food reaction 03/16/2018  . Allergic rhinitis 03/30/2017  . Mild persistent asthma 10/16/2015  . Gastroesophageal reflux disease without esophagitis 10/16/2015  . Lower respiratory tract infection 10/16/2015    Cline Crock PA-C

## 2021-05-28 IMAGING — DX DG CHEST 1V PORT
1 series · 1 of 1 positions shown · non-contrast
Comparison: None.

CLINICAL DATA: Aching, nausea and loss of appetite for 1 week.

EXAM:
PORTABLE CHEST 1 VIEW

[chest ap]
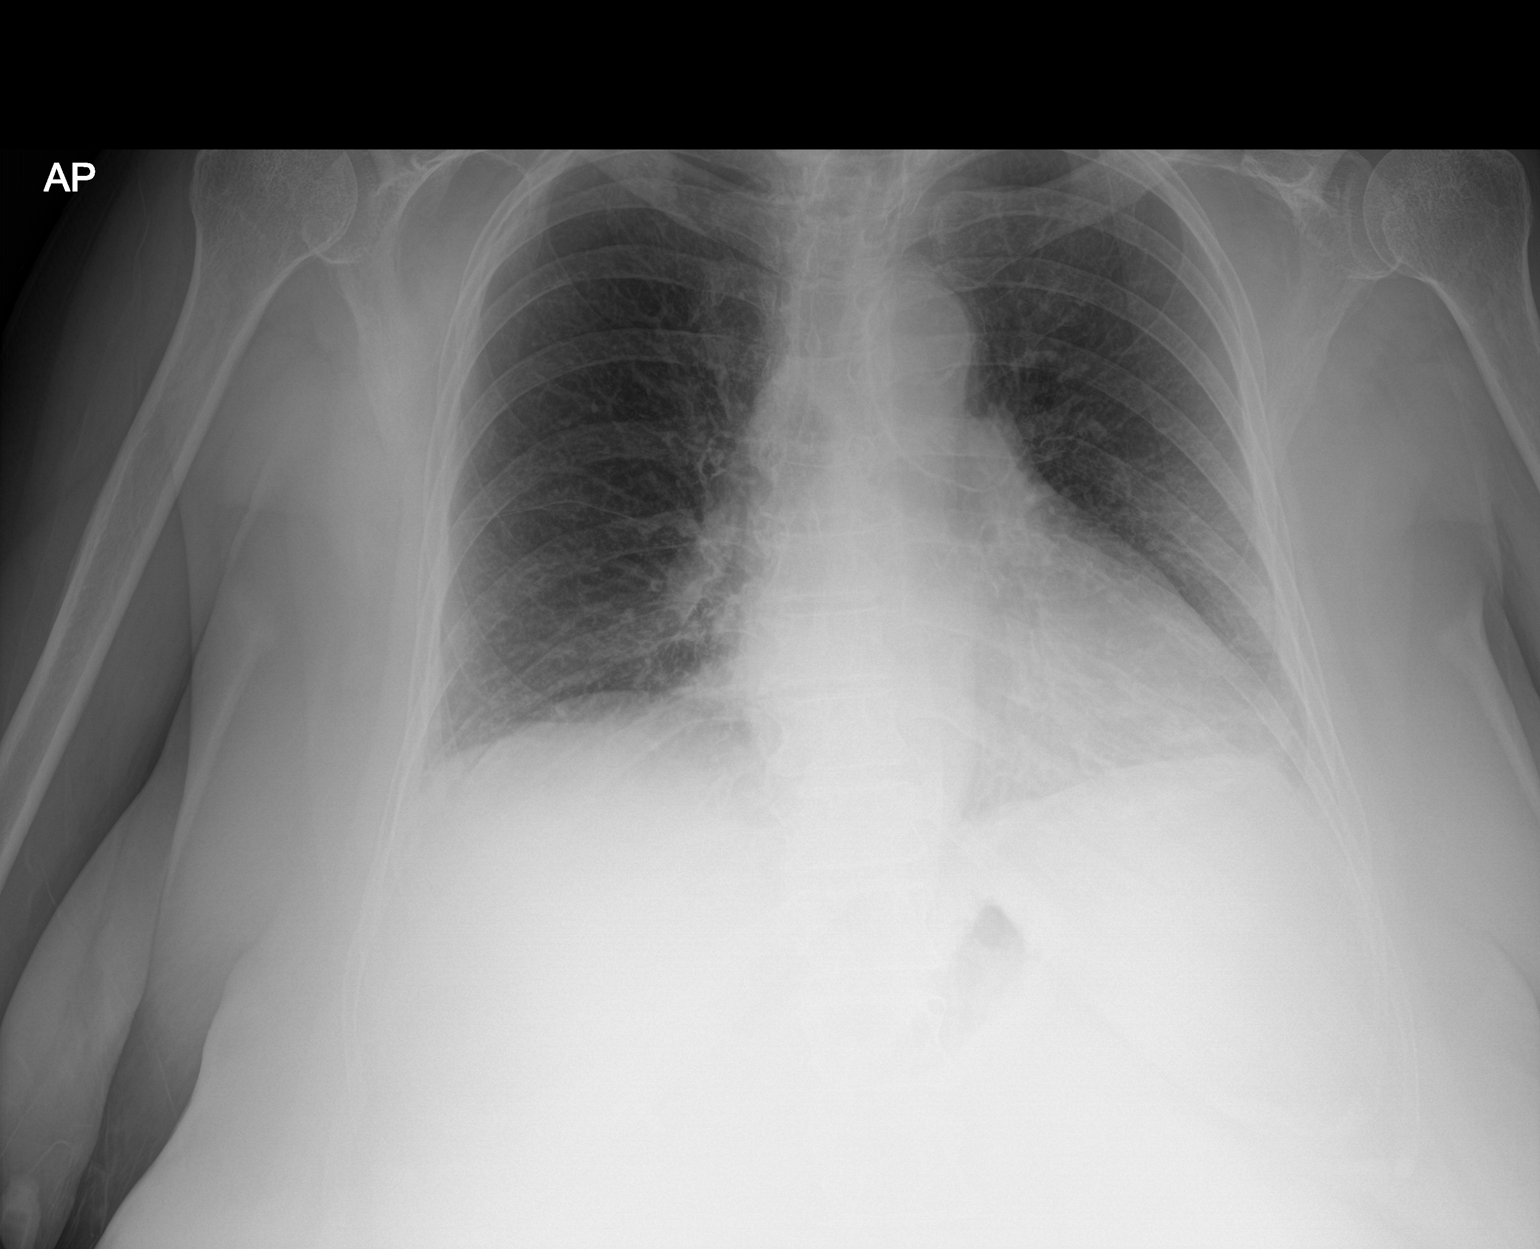

[1 of 1 positions shown; findings below may reference images not displayed]

FINDINGS: Lungs are clear. Heart size is normal. No pneumothorax or pleural
effusion. Aortic atherosclerosis. No acute or focal bony
abnormality.
IMPRESSION: No acute disease.

Atherosclerosis.

## 2022-09-25 DIAGNOSIS — R062 Wheezing: Secondary | ICD-10-CM | POA: Diagnosis not present

## 2022-09-25 DIAGNOSIS — R051 Acute cough: Secondary | ICD-10-CM | POA: Diagnosis not present

## 2022-10-04 ENCOUNTER — Ambulatory Visit: Payer: Medicare Other | Admitting: Internal Medicine

## 2022-10-04 ENCOUNTER — Encounter: Payer: Self-pay | Admitting: Internal Medicine

## 2022-10-04 VITALS — BP 140/80 | HR 58 | Temp 97.7°F | Resp 16 | Ht 61.0 in | Wt 159.3 lb

## 2022-10-04 DIAGNOSIS — J040 Acute laryngitis: Secondary | ICD-10-CM | POA: Diagnosis not present

## 2022-10-04 DIAGNOSIS — J4541 Moderate persistent asthma with (acute) exacerbation: Secondary | ICD-10-CM

## 2022-10-04 DIAGNOSIS — J4540 Moderate persistent asthma, uncomplicated: Secondary | ICD-10-CM

## 2022-10-04 MED ORDER — BUDESONIDE-FORMOTEROL FUMARATE 80-4.5 MCG/ACT IN AERO: 2.0000 | INHALATION_SPRAY | Freq: Two times a day (BID) | RESPIRATORY_TRACT | 5 refills | Status: DC

## 2022-10-04 MED ORDER — METHYLPREDNISOLONE ACETATE 40 MG/ML IJ SUSP
40.0000 mg | Freq: Once | INTRAMUSCULAR | Status: AC
  Administered 2022-10-04: 40 mg via INTRAMUSCULAR

## 2022-10-04 NOTE — Progress Notes (Unsigned)
New Patient Note  RE: Gina Estrada MRN: [id number] DOB: 03/01/1946 Date of Office Visit: 10/04/2022  Consult requested by: Tarri Fuller, MD Primary care provider: Tarri Fuller, MD  Chief Complaint: Allergic Reaction  History of Present Illness: I had the pleasure of seeing Gina Estrada for initial evaluation at the [facility] on 10/05/2022. She is a 76 y.o. female, who is referred here by Tarri Fuller, MD for the evaluation of hoarseness and cough .  History obtained from patient, chart review.  She previously followed with asthma and allergy in 2018 for asthma and allergic rhinitis.  She reports contracting COVID-19 virus in March 2020 and subsequently "all of my allergy symptoms went away".  She has not needed any antihistamines, nasal steroids, inhaler since 2020 until 2 weeks ago.  11 days ago she had a fever and cough and presented to UC.  She was negative for Covid, RSV, Influenza.  CXR was normal.  They did not swab for strep.    She was treated with with cefdinir and benzoate pearls and prescribed an albuterol inhaler.  She also received albuterol neb treatment while in the UC. Marland Kitchen   She has also noticed increased asthma symptoms over the past few days where she feels like she can't catch a breath.  She will treat with albuterol and cold air with some response.  Associated with barking cough.  Symptoms will last 30-45 minutes.  She does get mild relief from albuterol inhaler  Previous FEV1 1.54 in 2018     Assessment and Plan: Gina Estrada is a 76 y.o. female with: Laryngitis  Moderate persistent asthma with acute exacerbation in adult - Plan: Spirometry with Graph Plan: Patient Instructions  Moderate Persistent  Asthma: not  well controlled - Breathing test today showed: significant inflammation in your lungs  - suspect you have acute laryngitis and prednisone will help with that - Prednisone 10mg  tablet pack: 2 tablets given in office today. Take  2 more tablets before bed today.  Then take 2 tablets twice a day for 2 more days. Then take 2 tablets once a day for 1 day. Then take 1 tablet once a day for 1 day.    PLAN:  - Spacer sample and demonstration provided. - Daily controller medication(s): Singulair 10mg  daily and Symbicort 80/4.79mcg two puffs twice daily with spacer - Prior to physical activity: albuterol 2 puffs 10-15 minutes before physical activity. - Rescue medications: albuterol 4 puffs every 4-6 hours as needed - Changes during respiratory infections or worsening symptoms: Increase Symbicort to 4 puffs twice daily for TWO WEEKS. - Asthma control goals:  * Full participation in all desired activities (may need albuterol before activity) * Albuterol use two time or less a week on average (not counting use with activity) * Cough interfering with sleep two time or less a month * Oral steroids no more than once a year * No hospitalizations   Follow up: 4 weeks   Thank you so much for letting me partake in your care today.  Don't hesitate to reach out if you have any additional concerns!  , MD  [facility] [place] [place]   Meds ordered this encounter  Medications   budesonide-formoterol (SYMBICORT) 80-4.5 MCG/ACT inhaler    Sig: Inhale 2 puffs into the lungs in the morning and at bedtime.    Dispense:  1 each    Refill:  5   methylPREDNISolone acetate (DEPO-MEDROL) injection 40 mg  Lab Orders  No laboratory test(s) ordered today    Other allergy screening: Asthma: yes Rhino conjunctivitis: yes Food allergy: no Medication allergy: yes Hymenoptera allergy: no Urticaria: no Eczema:no History of recurrent infections suggestive of immunodeficency: no  Diagnostics: Spirometry:  Tracings reviewed. Her effort: It was hard to get consistent efforts and there is a question as to whether this reflects a maximal maneuver. FVC: 1.34 L FEV1: 1.20 L, 55% predicted FEV1/FVC ratio:  90% Interpretation: Spirometry consistent with mixed obstructive and restrictive disease. After 4 puffs of albuterol there was no significant change in FEV1 or FVC  Please see scanned spirometry results for details.     Past Medical History: Patient Active Problem List   Diagnosis Date Noted   Itch of skin 03/16/2018   Adverse food reaction 03/16/2018   Allergic rhinitis 03/30/2017   Mild persistent asthma 10/16/2015   Gastroesophageal reflux disease without esophagitis 10/16/2015   Lower respiratory tract infection 10/16/2015   Past Medical History:  Diagnosis Date   Arthritis    Asthma    Past Surgical History: Past Surgical History:  Procedure Laterality Date   CESAREAN SECTION     Medication List:  Current Outpatient Medications  Medication Sig Dispense Refill   budesonide-formoterol (SYMBICORT) 80-4.5 MCG/ACT inhaler Inhale 2 puffs into the lungs in the morning and at bedtime. 1 each 5   No current facility-administered medications for this visit.   Allergies: Allergies  Allergen Reactions   Aspirin Other (See Comments)    Asthma Exacerbations   Penicillins Hives and Itching   Sulfa Antibiotics Hives and Itching   Codeine Nausea And Vomiting   Social History: Social History   Socioeconomic History   Marital status: Married    Spouse name: Not on file   Number of children: Not on file   Years of education: Not on file   Highest education level: Not on file  Occupational History   Not on file  Tobacco Use   Smoking status: Never    Passive exposure: Never   Smokeless tobacco: Never  Vaping Use   Vaping Use: Never used  Substance and Sexual Activity   Alcohol use: No   Drug use: No   Sexual activity: Not on file  Other Topics Concern   Not on file  Social History Narrative   Not on file   Social Determinants of Health   Financial Resource Strain: Not on file  Food Insecurity: Not on file  Transportation Needs: Not on file  Physical Activity:  Not on file  Stress: Not on file  Social Connections: Not on file   Lives in a single family home, there are no roaches in the house, bed is 2 ft off the floor.  No dust precautions on bed or pillows.  No hepa filter in home and [place] not near and [place] and [place] area . Smoking: no exposure  Occupation: retired   Landscape architect HistorySurveyor, minerals in the house: no Engineer, civil (consulting) in the family room: no Carpet in the bedroom: yes Heating: electric Cooling: central Pet: no  Family History: Family History  Problem Relation Age of Onset   Allergic rhinitis Sister    Angioedema Neg Hx    Asthma Neg Hx    Eczema Neg Hx    Immunodeficiency Neg Hx    Urticaria Neg Hx      ROS: All others negative except as noted per HPI.   Objective: BP (!) 140/80   Pulse (!) 58   Temp  97.7 F (36.5 C) (Temporal)   Resp 16   Ht 5\' 1"  (1.549 m)   Wt 159 lb 4.8 oz (72.3 kg)   SpO2 98%   BMI 30.10 kg/m  Body mass index is 30.1 kg/m.  General Appearance:  Alert, cooperative, no distress, appears stated age  Head:  Normocephalic, without obvious abnormality, atraumatic  Eyes:  Conjunctiva clear, EOM's intact  Nose: Nares normal, hypertrophic turbinates, no visible anterior polyps, and septum midline  Throat: Lips, tongue normal; teeth and gums normal,  erythema, no tonsillar exudate, and + cobblestoning  Neck: Supple, symmetrical  Lungs:   clear to auscultation bilaterally, Respirations unlabored, intermittent dry coughing  Heart:  regular rate and rhythm and no murmur, Appears well perfused  Extremities: No edema  Skin: Skin color, texture, turgor normal, no rashes or lesions on visualized portions of skin  Neurologic: No Estrada deficits   The plan was reviewed with the patient/family, and all questions/concerned were addressed.  It was my pleasure to see Gina Estrada today and participate in her care. Please feel free to contact me with any questions or  concerns.  Sincerely,  Gina Gross, MD Allergy & Immunology  [facility] office: [phone] [facility] office: [phone]

## 2022-10-04 NOTE — Patient Instructions (Signed)
Moderate Persistent  Asthma: not  well controlled - Breathing test today showed: significant inflammation in your lungs  - suspect you have acute laryngitis and prednisone will help with that - Prednisone 10mg  tablet pack: 2 tablets given in office today. Take 2 more tablets before bed today.  Then take 2 tablets twice a day for 2 more days. Then take 2 tablets once a day for 1 day. Then take 1 tablet once a day for 1 day.    PLAN:  - Spacer sample and demonstration provided. - Daily controller medication(s): Singulair 10mg  daily and Symbicort 80/4.14mcg two puffs twice daily with spacer - Prior to physical activity: albuterol 2 puffs 10-15 minutes before physical activity. - Rescue medications: albuterol 4 puffs every 4-6 hours as needed - Changes during respiratory infections or worsening symptoms: Increase Symbicort to 4 puffs twice daily for TWO WEEKS. - Asthma control goals:  * Full participation in all desired activities (may need albuterol before activity) * Albuterol use two time or less a week on average (not counting use with activity) * Cough interfering with sleep two time or less a month * Oral steroids no more than once a year * No hospitalizations   Follow up: 4 weeks   Thank you so much for letting me partake in your care today.  Don't hesitate to reach out if you have any additional concerns!  , MD  [facility] [place] [place]

## 2022-10-06 ENCOUNTER — Telehealth: Payer: Self-pay | Admitting: Internal Medicine

## 2022-10-06 NOTE — Telephone Encounter (Signed)
Please advise. Thanks.  

## 2022-10-06 NOTE — Telephone Encounter (Signed)
Pt states Dr. Marlynn Perking told her to call back today if she was not better, she states she is still coughing, has a lot of drainage and she still has no voice Please call her on her Cell phone [phone] pt states it is okay to leave a message if needed

## 2022-10-08 ENCOUNTER — Ambulatory Visit (INDEPENDENT_AMBULATORY_CARE_PROVIDER_SITE_OTHER): Payer: Medicare Other | Admitting: Family Medicine

## 2022-10-08 ENCOUNTER — Telehealth: Payer: Self-pay | Admitting: Family Medicine

## 2022-10-08 ENCOUNTER — Other Ambulatory Visit: Payer: Self-pay | Admitting: Family Medicine

## 2022-10-08 DIAGNOSIS — J040 Acute laryngitis: Secondary | ICD-10-CM

## 2022-10-08 DIAGNOSIS — R052 Subacute cough: Secondary | ICD-10-CM | POA: Diagnosis not present

## 2022-10-08 DIAGNOSIS — J4541 Moderate persistent asthma with (acute) exacerbation: Secondary | ICD-10-CM

## 2022-10-08 DIAGNOSIS — J45909 Unspecified asthma, uncomplicated: Secondary | ICD-10-CM | POA: Insufficient documentation

## 2022-10-08 DIAGNOSIS — J310 Chronic rhinitis: Secondary | ICD-10-CM

## 2022-10-08 MED ORDER — PREDNISONE 10 MG PO TABS: ORAL_TABLET | ORAL | 0 refills | Status: DC

## 2022-10-08 NOTE — Progress Notes (Signed)
RE: Gina Estrada MRN: [id number] DOB: 02-11-1946 Date of Telemedicine Visit: 10/08/2022  Referring provider: Tarri Fuller, MD Primary care provider: Tarri Fuller, MD  Chief Complaint: No chief complaint on file.   Telemedicine Follow Up Visit via Telephone: I connected with Gina Estrada for a follow up on 10/08/22 by telephone and verified that I am speaking with the correct person using two identifiers.   I discussed the limitations, risks, security and privacy concerns of performing an evaluation and management service by telephone and the availability of in person appointments. I also discussed with the patient that there may be a patient responsible charge related to this service. The patient expressed understanding and agreed to proceed.  Patient is at home  Provider is at the office.  Visit start time: 1130 Visit end time: 1151 Insurance consent/check in by: [facility] Medical consent and medical assistant/nurse: Hilda Lias  History of Present Illness: She is a 76 y.o. female, who is being followed for asthma, cough, and voice hoarseness. Her previous allergy office visit was on 10/04/2022 with  Dr Marlynn Perking .  She reports that about 2 weeks ago she was experiencing fever and was exposed to many sick contacts.  At that time she went to urgent care and had a negative respiratory panel virus.  She visited Dr. Marlynn Perking on 10/04/2022 for symptoms of laryngitis, voice hoarseness, and cough at which time she received a prednisone taper.  She reports some improvement with the higher dose prednisone, however, symptoms returned with lowered doses toward the end of the prednisone taper..  At today's visit, she reports that she continues to experience cough producing mucus ranging in color from clear to green and is having some shortness of breath with coughing only.  She denies wheezing.  She continues montelukast 10 mg once a day, Symbicort 80-2 puffs twice a day and is not using albuterol.   Allergic rhinitis is reported as poorly controlled with symptoms including nasal congestion, thick drainage ranging in color from clear to green and copious postnasal drainage.  She denies headache and sinus pain or pressure.  She is not currently taking an antihistamine, using Flonase nasal spray, or using saline nasal rinses.  She is not currently taking Mucinex.  At this time, she denies fever and sick contacts.  Her current medications are listed in the chart.   Assessment and Plan: Gina Estrada is a 76 y.o. female with: There are no Patient Instructions on file for this visit.  No follow-ups on file.  Meds ordered this encounter  Medications   predniSONE (DELTASONE) 10 MG tablet    Sig: .aapre    Dispense:  9 tablet    Refill:  0    Medication List:  Current Outpatient Medications  Medication Sig Dispense Refill   predniSONE (DELTASONE) 10 MG tablet .aapre 9 tablet 0   budesonide-formoterol (SYMBICORT) 80-4.5 MCG/ACT inhaler Inhale 2 puffs into the lungs in the morning and at bedtime. 1 each 5   No current facility-administered medications for this visit.   Allergies: Allergies  Allergen Reactions   Aspirin Other (See Comments)    Asthma Exacerbations   Penicillins Hives and Itching   Sulfa Antibiotics Hives and Itching   Codeine Nausea And Vomiting   I reviewed her past medical history, social history, family history, and environmental history and no significant changes have been reported from previous visit on 10/04/2022.   Objective: Physical Exam Not obtained as encounter was done via telephone.   Previous notes and tests were reviewed.  I discussed the assessment and treatment plan with the patient. The patient was provided an opportunity to ask questions and all were answered. The patient agreed with the plan and demonstrated an understanding of the instructions.   The patient was advised to call back or seek an in-person evaluation if the symptoms worsen or if the  condition fails to improve as anticipated.  I provided 21 minutes of non-face-to-face time during this encounter.  It was my pleasure to participate in Gina Estrada's care today. Please feel free to contact me with any questions or concerns.   Sincerely,  Thermon Leyland, FNP

## 2022-10-08 NOTE — Telephone Encounter (Signed)
Thank you. This has been clarified

## 2022-10-08 NOTE — Telephone Encounter (Signed)
Corrected rx of prednisone has been sent in to [facility]

## 2022-10-08 NOTE — Telephone Encounter (Signed)
Patient called stating [facility] needed clarification on RX medication prednisone

## 2022-11-01 ENCOUNTER — Ambulatory Visit: Payer: Medicare Other | Admitting: Internal Medicine

## 2022-11-01 ENCOUNTER — Encounter: Payer: Self-pay | Admitting: Internal Medicine

## 2022-11-01 VITALS — BP 140/64 | HR 88 | Temp 97.6°F | Resp 17 | Ht 61.0 in | Wt 161.7 lb

## 2022-11-01 DIAGNOSIS — J040 Acute laryngitis: Secondary | ICD-10-CM

## 2022-11-01 DIAGNOSIS — J4540 Moderate persistent asthma, uncomplicated: Secondary | ICD-10-CM | POA: Diagnosis not present

## 2022-11-01 DIAGNOSIS — J4541 Moderate persistent asthma with (acute) exacerbation: Secondary | ICD-10-CM

## 2022-11-01 DIAGNOSIS — K219 Gastro-esophageal reflux disease without esophagitis: Secondary | ICD-10-CM

## 2022-11-01 DIAGNOSIS — J3089 Other allergic rhinitis: Secondary | ICD-10-CM

## 2022-11-01 MED ORDER — TRELEGY ELLIPTA 200-62.5-25 MCG/ACT IN AEPB: 1.0000 | INHALATION_SPRAY | Freq: Every day | RESPIRATORY_TRACT | 5 refills | Status: DC

## 2022-11-01 NOTE — Patient Instructions (Addendum)
Moderate Persistent  Asthma: not well controlled  - Breathing test today showed: Persistent inflammation in your lungs  - Based on breathing test and symptoms, asthma is not well controlled and we need to step up care  - Sample of Trelegy 226mcg given today   PLAN:  - Spacer sample and demonstration provided. - Daily controller medication(s):  Trekegy 223mcg 1 puff daily and Singulair 10mg  daily - Prior to physical activity: albuterol 2 puffs 10-15 minutes before physical activity. - Rescue medications: albuterol 4 puffs every 4-6 hours as needed - Changes during respiratory infections or worsening symptoms: Increase Symbicort to 4 puffs twice daily for TWO WEEKS. - Asthma control goals:  * Full participation in all desired activities (may need albuterol before activity) * Albuterol use two time or less a week on average (not counting use with activity) * Cough interfering with sleep two time or less a month * Oral steroids no more than once a year * No hospitalizations  Chronic  Rhinitis not well controlled : - Continue Nasal Steroid Spray: Options include Flonase (fluticasone), Nasocort (triamcinolone), Nasonex (mometasome) 1- 2 sprays in each nostril daily (can buy over-the-counter if not covered by insurance)  Best results if used daily. - Continue Singulair (Montelukast) 10mg  nightly. - Consider over the counter antihistamine daily or daily as needed.   -Your options include Zyrtec (Cetirizine) 10mg , Claritin (Loratadine) 10mg , Allegra (Fexofenadine) 180mg , or Xyzal (Levocetirinze) 5mg  -Start Sinus rinses twice daily prior to nasal sprays to improve how well they work  - Can continue mucinex DM, but make sure you stay hydrated   If not response in 6 weeks, we will consider and ENT evaluation for hoarsness.   Follow up: 6 weeks   Thank you so much for letting me partake in your care today.  Don't hesitate to reach out if you have any additional concerns!  Roney Marion, MD   [facility] [place], [place]

## 2022-11-01 NOTE — Progress Notes (Signed)
Follow Up Note  RE: Gina Estrada MRN: [id number] DOB: [birth year] Date of Office Visit: 11/01/2022  Referring provider: Rubie Maid, MD Primary care provider: Rubie Maid, MD  Chief Complaint: No chief complaint on file.  History of Present Illness: I had the pleasure of seeing Gina Estrada for a follow up visit at the [name] on 11/01/2022. She is a 77 y.o. female, who is being followed for layngitis. Her previous allergy office visit was on 10/04/22 with Dr. Edison Pace. Today is a regular follow up visit.  History obtained from patient, chart review.  At last visit she was given depo-medrol 40mg  IM, started on prednisone for laryngitis and stepped up to Symbicort 80 mcg 2 puffs twice daily and Singulair 10 mg daily.  She reported initial response and then had a telehealth visit with Gareth Morgan nurse practitioner on 22 December.  She was complaining of a lot of postnasal drip and was started on Mucinex.  Today she reports  Compliant with symbicort 57mcg 2 puffs twice daily, she is washing her mouth out after use. She reports compliance with Singulair 10 mg daily. Still with episodic coughing and hoarsness.  Moderately improved.   Still with post nasal drip, currently taking mucinex-DM with some improvement.    She is taking reflux medication and denies any breakthrough reflux.    She restarted an OTC nasal spray daily.  Denies any nose bleeds .  She is using sinus rinses without good effect.  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   Of note at initial presentation of symptoms in December she had presented in urgent care and had a negative respiratory panel as well as normal chest x-ray.  Pertinent History/Diagnostics:  - Asthma: persistent  - mixed obstructive/restrictive spirometry (10/04/22): ratio 90, 1.20L, 55% FEV1 (pre), no post-BD change    Assessment and Plan: [name] is a 77 y.o. female  with: Moderate persistent asthma with acute exacerbation in adult - Plan: Spirometry with Graph  Laryngitis  Gastroesophageal reflux disease without esophagitis  Other allergic rhinitis Plan: Patient Instructions  Moderate Persistent  Asthma: not well controlled  - Breathing test today showed: Persistent inflammation in your lungs  - Based on breathing test and symptoms, asthma is not well controlled and we need to step up care  - Sample of Trelegy 226mcg given today   PLAN:  - Spacer sample and demonstration provided. - Daily controller medication(s):  Trekegy 29mcg 1 puff daily and Singulair 10mg  daily - Prior to physical activity: albuterol 2 puffs 10-15 minutes before physical activity. - Rescue medications: albuterol 4 puffs every 4-6 hours as needed - Changes during respiratory infections or worsening symptoms: Increase Symbicort to 4 puffs twice daily for TWO WEEKS. - Asthma control goals:  * Full participation in all desired activities (may need albuterol before activity) * Albuterol use two time or less a week on average (not counting use with activity) * Cough interfering with sleep two time or less a month * Oral steroids no more than once a year * No hospitalizations  Chronic  Rhinitis not well controlled : - Continue Nasal Steroid Spray: Options include Flonase (fluticasone), Nasocort (triamcinolone), Nasonex (mometasome) 1- 2 sprays in each nostril daily (can buy over-the-counter if not covered by insurance)  Best results if used daily. - Continue Singulair (Montelukast) 10mg  nightly. - Consider over the counter antihistamine daily or daily as needed.   -Your options include Zyrtec (Cetirizine)  10mg , Claritin (Loratadine) 10mg , Allegra (Fexofenadine) 180mg , or Xyzal (Levocetirinze) 5mg  -Start Sinus rinses twice daily prior to nasal sprays to improve how well they work  - Can continue mucinex DM, but make sure you stay hydrated   If not response in 6 weeks, we will  consider and ENT evaluation for hoarsness.   Follow up: 6 weeks   Thank you so much for letting me partake in your care today.  Don't hesitate to reach out if you have any additional concerns!  Roney Marion, MD  [facility] [place], [place] No follow-ups on file.  Meds ordered this encounter  Medications   Fluticasone-Umeclidin-Vilant (TRELEGY ELLIPTA) 200-62.5-25 MCG/ACT AEPB    Sig: Inhale 1 puff into the lungs daily.    Dispense:  1 each    Refill:  5    Lab Orders  No laboratory test(s) ordered today   Diagnostics: Spirometry:  Tracings reviewed. Her effort: Good reproducible efforts. FVC: 1.40L FEV1: 49L, 1.21% predicted FEV1/FVC ratio: 55% Interpretation: Spirometry consistent with mixed obstructive and restrictive disease.  Please see scanned spirometry results for details.    Medication List:  Current Outpatient Medications  Medication Sig Dispense Refill   Fluticasone-Umeclidin-Vilant (TRELEGY ELLIPTA) 200-62.5-25 MCG/ACT AEPB Inhale 1 puff into the lungs daily. 1 each 5   predniSONE (DELTASONE) 10 MG tablet 2 tablets twice a day for 3 days then 2 tablets on 4th day then 1 tablet on firth day then stop 15 tablet 0   No current facility-administered medications for this visit.   Allergies: Allergies  Allergen Reactions   Aspirin Other (See Comments)    Asthma Exacerbations   Penicillins Hives and Itching   Sulfa Antibiotics Hives and Itching   Codeine Nausea And Vomiting   I reviewed her past medical history, social history, family history, and environmental history and no significant changes have been reported from her previous visit.  ROS: All others negative except as noted per HPI.   Objective: BP (!) 140/64   Pulse 88   Temp 97.6 F (36.4 C) (Temporal)   Resp 17   Ht 5\' 1"  (1.549 m)   Wt 161 lb 11.2 oz (73.3 kg)   SpO2 98%   BMI 30.55 kg/m  Body mass index is 30.55 kg/m. General Appearance:  Alert, cooperative, no  distress, appears stated age  Head:  Normocephalic, without obvious abnormality, atraumatic  Eyes:  Conjunctiva clear, EOM's intact  Nose: Nares normal,  erythematous nasal mucosa, no visible anterior polyps, and septum midline  Throat: Lips, tongue normal; teeth and gums normal, normal posterior oropharynx  Neck: Supple, symmetrical  Lungs:   clear to auscultation bilaterally, Respirations unlabored, intermittent dry coughing  Heart:  regular rate and rhythm and no murmur, Appears well perfused  Extremities: No edema  Skin: Skin color, texture, turgor normal, no rashes or lesions on visualized portions of skin   Neurologic: No gross deficits   Previous notes and tests were reviewed. The plan was reviewed with the patient/family, and all questions/concerned were addressed.  It was my pleasure to see Lynee today and participate in her care. Please feel free to contact me with any questions or concerns.  Sincerely,  Roney Marion, MD  Allergy & Immunology  [facility] [place] [facility] Office: [phone]

## 2022-12-13 ENCOUNTER — Ambulatory Visit: Payer: Medicare Other | Admitting: Internal Medicine

## 2022-12-13 ENCOUNTER — Encounter: Payer: Self-pay | Admitting: Internal Medicine

## 2022-12-13 VITALS — BP 140/66 | HR 80 | Temp 97.7°F | Resp 18

## 2022-12-13 DIAGNOSIS — Z9889 Other specified postprocedural states: Secondary | ICD-10-CM

## 2022-12-13 DIAGNOSIS — K219 Gastro-esophageal reflux disease without esophagitis: Secondary | ICD-10-CM

## 2022-12-13 DIAGNOSIS — J4540 Moderate persistent asthma, uncomplicated: Secondary | ICD-10-CM | POA: Diagnosis not present

## 2022-12-13 DIAGNOSIS — J3089 Other allergic rhinitis: Secondary | ICD-10-CM | POA: Diagnosis not present

## 2022-12-13 MED ORDER — BUDESONIDE-FORMOTEROL FUMARATE 160-4.5 MCG/ACT IN AERO: 2.0000 | INHALATION_SPRAY | Freq: Two times a day (BID) | RESPIRATORY_TRACT | 5 refills | Status: DC

## 2022-12-13 MED ORDER — BUDESONIDE-FORMOTEROL FUMARATE 160-4.5 MCG/ACT IN AERO: 2.0000 | INHALATION_SPRAY | Freq: Two times a day (BID) | RESPIRATORY_TRACT | 6 refills | Status: DC

## 2022-12-13 MED ORDER — MONTELUKAST SODIUM 10 MG PO TABS: 10.0000 mg | ORAL_TABLET | Freq: Every day | ORAL | 5 refills | Status: DC

## 2022-12-13 NOTE — Patient Instructions (Addendum)
Moderate Persistent  Asthma: improved -Will get breathing test at next visit -Step Down to Symbicort 160 mcg 2 puffs twice daily   PLAN:  - Spacer sample and demonstration provided. - Daily controller medication(s):  Symbicort 160 mcg 2 puffs twice daily  and Singulair '10mg'$  daily - Prior to physical activity: albuterol 2 puffs 10-15 minutes before physical activity. - Rescue medications: albuterol 4 puffs every 4-6 hours as needed - Changes during respiratory infections or worsening symptoms: Add on Symbicort to 2 puffs twice daily for TWO WEEKS. - Asthma control goals:  * Full participation in all desired activities (may need albuterol before activity) * Albuterol use two time or less a week on average (not counting use with activity) * Cough interfering with sleep two time or less a month * Oral steroids no more than once a year * No hospitalizations  Chronic  Rhinitis:  - Continue Nasal Steroid Spray: Options include Flonase (fluticasone), Nasocort (triamcinolone), Nasonex (mometasome) 1- 2 sprays in each nostril daily (can buy over-the-counter if not covered by insurance)  Best results if used daily. - Continue Singulair (Montelukast) '10mg'$  nightly. - Consider over the counter antihistamine daily or daily as needed.   -Your options include Zyrtec (Cetirizine) '10mg'$ , Claritin (Loratadine) '10mg'$ , Allegra (Fexofenadine) '180mg'$ , or Xyzal (Levocetirinze) '5mg'$  - Continue  Sinus rinses twice daily prior to nasal sprays to improve how well they work  - Can continue mucinex DM, but make sure you stay hydrated   Follow up:2 months   Thank you so much for letting me partake in your care today.  Don't hesitate to reach out if you have any additional concerns!  Roney Marion, MD  [facility] [place], [place]

## 2022-12-13 NOTE — Progress Notes (Unsigned)
Follow Up Note  RE: Gina Estrada MRN: [id number] DOB: 20-Jan-1946 Date of Office Visit: 12/13/2022  Referring provider: Rubie Maid, MD Primary care provider: Rubie Maid, MD  Chief Complaint: Follow-up (Pt states she is doing very well since having her dental surgery.) and Asthma  History of Present Illness: I had the pleasure of seeing Gina Estrada for a follow up visit at the [facility] on 12/14/2022. She is a 77 y.o. female, who is being followed for persistent asthma, rhinitis, . Her previous allergy office visit was on 11/01/22 with Dr. Edison Pace. Today is a regular follow up visit.  History obtained from patient, chart review.  Today she reports   Significant improvement in laryngitis after prednisone.  She has realized her GERD will trigger her prednisone as all she controls this with over-the-counter PPIs symptoms do not recur.  She is not a fan of Trelegy and feels like she is better controlled on Symbicort.  She continues Singulair daily.  Using Zyrtec and Flonase as needed a few times a week for rhinitis symptoms.  She recently discovered she had an abscess after tooth extraction a year ago.  This was covered by her dentist and she was referred back to the endodontist for surgical repair.  She still recovering and has swelling on the right side.  She finishes antibiotics tomorrow.  Pertinent History/Diagnostics:  - Asthma: persistent, stepped up to zTrelegy 225mg 1/24  - Depomedrol and prednisone given for layngitis 12/23             - mixed obstructive/restrictive spirometry (10/04/22): ratio 90, 1.20L, 55% FEV1 (pre), no post-BD change  Assessment and Plan: KBrealynis a 77y.o. female with: Moderate persistent asthma without complication  Other allergic rhinitis  Gastroesophageal reflux disease without esophagitis  History of recent dental procedure   Plan: Patient Instructions  Moderate Persistent  Asthma: improved -Will get breathing  test at next visit -Step Down to Symbicort 160 mcg 2 puffs twice daily   PLAN:  - Spacer sample and demonstration provided. - Daily controller medication(s):  Symbicort 160 mcg 2 puffs twice daily  and Singulair '10mg'$  daily - Prior to physical activity: albuterol 2 puffs 10-15 minutes before physical activity. - Rescue medications: albuterol 4 puffs every 4-6 hours as needed - Changes during respiratory infections or worsening symptoms: Add on Symbicort to 2 puffs twice daily for TWO WEEKS. - Asthma control goals:  * Full participation in all desired activities (may need albuterol before activity) * Albuterol use two time or less a week on average (not counting use with activity) * Cough interfering with sleep two time or less a month * Oral steroids no more than once a year * No hospitalizations  Chronic  Rhinitis:  - Continue Nasal Steroid Spray: Options include Flonase (fluticasone), Nasocort (triamcinolone), Nasonex (mometasome) 1- 2 sprays in each nostril daily (can buy over-the-counter if not covered by insurance)  Best results if used daily. - Continue Singulair (Montelukast) '10mg'$  nightly. - Consider over the counter antihistamine daily or daily as needed.   -Your options include Zyrtec (Cetirizine) '10mg'$ , Claritin (Loratadine) '10mg'$ , Allegra (Fexofenadine) '180mg'$ , or Xyzal (Levocetirinze) '5mg'$  - Continue  Sinus rinses twice daily prior to nasal sprays to improve how well they work  - Can continue mucinex DM, but make sure you stay hydrated   Follow up:2 months   Thank you so much for letting me partake in your care today.  Don't hesitate to reach out if you have  any additional concerns!  Roney Marion, MD  [facility] [place], [place]   Meds ordered this encounter  Medications   DISCONTD: budesonide-formoterol (SYMBICORT) 160-4.5 MCG/ACT inhaler    Sig: Inhale 2 puffs into the lungs 2 (two) times daily.    Dispense:  1 each    Refill:  5   montelukast  (SINGULAIR) 10 MG tablet    Sig: Take 1 tablet (10 mg total) by mouth at bedtime.    Dispense:  30 tablet    Refill:  5   DISCONTD: budesonide-formoterol (SYMBICORT) 160-4.5 MCG/ACT inhaler    Sig: Inhale 2 puffs into the lungs 2 (two) times daily.    Dispense:  1 each    Refill:  6    Lab Orders  No laboratory test(s) ordered today   Diagnostics: None done  Medication List:  Current Outpatient Medications  Medication Sig Dispense Refill   albuterol (VENTOLIN HFA) 108 (90 Base) MCG/ACT inhaler Inhale into the lungs.     EPINEPHrine 0.3 mg/0.3 mL IJ SOAJ injection Inject into the muscle.     montelukast (SINGULAIR) 10 MG tablet Take 1 tablet (10 mg total) by mouth at bedtime. 30 tablet 5   ranitidine (ZANTAC) 75 MG tablet      budesonide-formoterol (SYMBICORT) 160-4.5 MCG/ACT inhaler Inhale 2 puffs into the lungs 2 (two) times daily. 1 each 6   predniSONE (DELTASONE) 10 MG tablet 2 tablets twice a day for 3 days then 2 tablets on 4th day then 1 tablet on firth day then stop (Patient not taking: Reported on 12/13/2022) 15 tablet 0   No current facility-administered medications for this visit.   Allergies: Allergies  Allergen Reactions   Aspirin Other (See Comments)    Asthma Exacerbations   Penicillins Hives and Itching   Sulfa Antibiotics Hives and Itching   Codeine Nausea And Vomiting   I reviewed her past medical history, social history, family history, and environmental history and no significant changes have been reported from her previous visit.  ROS: All others negative except as noted per HPI.   Objective: BP (!) 140/66   Pulse 80   Temp 97.7 F (36.5 C) (Temporal)   Resp 18   SpO2 97%  There is no height or weight on file to calculate BMI. General Appearance:  Alert, cooperative, no distress, appears stated age  Head:  Normocephalic, without obvious abnormality, atraumatic, mild swelling on right cheek   Eyes:  Conjunctiva clear, EOM's intact  Nose: Nares  normal, normal mucosa, no visible anterior polyps, and septum midline  Throat: Lips, tongue normal; teeth and gums normal,   Neck: Supple, symmetrical  Lungs:   clear to auscultation bilaterally, Respirations unlabored, no coughing  Heart:  regular rate and rhythm and no murmur, Appears well perfused  Extremities: No edema  Skin: Skin color, texture, turgor normal, no rashes or lesions on visualized portions of skin  Neurologic: No gross deficits   Previous notes and tests were reviewed. The plan was reviewed with the patient/family, and all questions/concerned were addressed.  It was my pleasure to see Lishia today and participate in her care. Please feel free to contact me with any questions or concerns.  Sincerely,  Roney Marion, MD  Allergy & Immunology  [facility] [place] [facility] Office: [phone]

## 2022-12-14 ENCOUNTER — Other Ambulatory Visit: Payer: Self-pay | Admitting: *Deleted

## 2022-12-14 MED ORDER — BUDESONIDE-FORMOTEROL FUMARATE 160-4.5 MCG/ACT IN AERO: 2.0000 | INHALATION_SPRAY | Freq: Two times a day (BID) | RESPIRATORY_TRACT | 6 refills | Status: DC

## 2023-02-14 ENCOUNTER — Encounter: Payer: Self-pay | Admitting: Internal Medicine

## 2023-02-14 ENCOUNTER — Other Ambulatory Visit: Payer: Self-pay

## 2023-02-14 ENCOUNTER — Ambulatory Visit: Payer: Medicare Other | Admitting: Internal Medicine

## 2023-02-14 VITALS — BP 140/76 | HR 89 | Temp 98.1°F | Resp 16 | Ht 60.0 in | Wt 164.4 lb

## 2023-02-14 DIAGNOSIS — K219 Gastro-esophageal reflux disease without esophagitis: Secondary | ICD-10-CM | POA: Diagnosis not present

## 2023-02-14 DIAGNOSIS — J4540 Moderate persistent asthma, uncomplicated: Secondary | ICD-10-CM

## 2023-02-14 DIAGNOSIS — J3089 Other allergic rhinitis: Secondary | ICD-10-CM | POA: Diagnosis not present

## 2023-02-14 NOTE — Patient Instructions (Addendum)
Moderate Persistent  Asthma: improved -Breathing tests today are improved, but not at goal   PLAN:  - Spacer sample and demonstration provided. - Daily controller medication(s):  Symbicort 160 mcg 2 puffs twice daily  - Prior to physical activity: albuterol 2 puffs 10-15 minutes before physical activity. - Rescue medications: albuterol 4 puffs every 4-6 hours as needed - Changes during respiratory infections or worsening symptoms: Increase Symbicort to 4 puffs twice daily for TWO WEEKS. - Asthma control goals:  * Full participation in all desired activities (may need albuterol before activity) * Albuterol use two time or less a week on average (not counting use with activity) * Cough interfering with sleep two time or less a month * Oral steroids no more than once a year * No hospitalizations  Chronic  Rhinitis: not well controlled  - Continue Floanse 1 spray per nostril twice daily (use after sinus rinses )  - Consider Zyrtec 10mg  daily, can add an extra dose for breakthrough symptoms  - Start Sinus rinses twice daily prior to nasal sprays to improve how well they work  - Can continue mucinex DM, but make sure you stay hydrated   Follow up: 2 months   Thank you so much for letting me partake in your care today.  Don't hesitate to reach out if you have any additional concerns!  Ferol Luz, MD  [facility] [place], [place]

## 2023-02-14 NOTE — Progress Notes (Signed)
Follow Up Note  RE: Luva Nondorf MRN: [id number] DOB: 1945-12-11 Date of Office Visit: 02/14/2023  Referring provider: Tarri Fuller, MD Primary care provider: Tarri Fuller, MD  Chief Complaint: Asthma (Symbicort causes congestion /Patient stopped singular due to sleep issues now uses zyrtec )  History of Present Illness: I had the pleasure of seeing Zuleyka Manner for a follow up visit at [facility] [place] on 02/16/2023. She is a 77 y.o. female, who is being followed for persistent asthma, rhinitis, . Her previous allergy office visit was on 12/11/22 with Dr. Marlynn Perking. Today is a regular follow up visit.  History obtained from patient, chart review.  Today she reports  She was having sleep disturbances and stopped singulair.  Symbicort is causing nasal congestion, but she has continued to take 2 puffs twice daily with spacer and rinsing mouth. No horaseness of voice changes.  Has not needed albuterol.  No OCS/antibiotics/ED/urgent care visits since last appointment Also having increasing PND and rhinnorhea.  Not doing sinus rinses.  Not taking mucinex.  Pertinent History/Diagnostics:  - Asthma: persistent, stepped up to Trelegy 1/24  - Depomedrol and prednisone given for layngitis 12/23             - mixed obstructive/restrictive spirometry (10/04/22): ratio 90, 1.20L, 55% FEV1 (pre), no post-BD change  Assessment and Plan: Heartley is a 77 y.o. female with: Moderate persistent asthma without complication - Plan: Spirometry with Graph  Other allergic rhinitis  Gastroesophageal reflux disease without esophagitis   Plan: Patient Instructions  Moderate Persistent  Asthma: improved -Breathing tests today are improved, but not at goal   PLAN:  - Spacer sample and demonstration provided. - Daily controller medication(s):  Symbicort 160 mcg 2 puffs twice daily  - Prior to physical activity: albuterol 2 puffs 10-15 minutes before physical activity. -  Rescue medications: albuterol 4 puffs every 4-6 hours as needed - Changes during respiratory infections or worsening symptoms: Increase Symbicort to 4 puffs twice daily for TWO WEEKS. - Asthma control goals:  * Full participation in all desired activities (may need albuterol before activity) * Albuterol use two time or less a week on average (not counting use with activity) * Cough interfering with sleep two time or less a month * Oral steroids no more than once a year * No hospitalizations  Chronic  Rhinitis: not well controlled  - Continue Floanse 1 spray per nostril twice daily (use after sinus rinses )  - Consider Zyrtec 10mg  daily, can add an extra dose for breakthrough symptoms  - Start Sinus rinses twice daily prior to nasal sprays to improve how well they work  - Can continue mucinex DM, but make sure you stay hydrated   Follow up: 2 months   Thank you so much for letting me partake in your care today.  Don't hesitate to reach out if you have any additional concerns!  Ferol Luz, MD  [facility] [place], [place]   Meds ordered this encounter  Medications   fluticasone (FLONASE) 50 MCG/ACT nasal spray    Sig: Place 1 spray into both nostrils in the morning and at bedtime.    Dispense:  16 g    Refill:  2    Lab Orders  No laboratory test(s) ordered today   Diagnostics: Spirometry:  Tracings reviewed. Her effort: Good reproducible efforts. FVC: 1.53L FEV1: 1.34L, 64% predicted FEV1/FVC ratio: 88% Interpretation: Spirometry consistent with mixed obstructive and restrictive disease.  Please  see scanned spirometry results for details.   Medication List:  Current Outpatient Medications  Medication Sig Dispense Refill   albuterol (VENTOLIN HFA) 108 (90 Base) MCG/ACT inhaler Inhale into the lungs.     budesonide-formoterol (SYMBICORT) 160-4.5 MCG/ACT inhaler Inhale 2 puffs into the lungs 2 (two) times daily. 1 each 6   fluticasone (FLONASE) 50  MCG/ACT nasal spray Place 1 spray into both nostrils in the morning and at bedtime. 16 g 2   predniSONE (DELTASONE) 10 MG tablet 2 tablets twice a day for 3 days then 2 tablets on 4th day then 1 tablet on firth day then stop 15 tablet 0   ranitidine (ZANTAC) 75 MG tablet      EPINEPHrine 0.3 mg/0.3 mL IJ SOAJ injection Inject into the muscle. (Patient not taking: Reported on 02/14/2023)     montelukast (SINGULAIR) 10 MG tablet Take 1 tablet (10 mg total) by mouth at bedtime. (Patient not taking: Reported on 02/14/2023) 30 tablet 5   No current facility-administered medications for this visit.   Allergies: Allergies  Allergen Reactions   Aspirin Other (See Comments)    Asthma Exacerbations   Penicillins Hives and Itching   Sulfa Antibiotics Hives and Itching   Codeine Nausea And Vomiting   I reviewed her past medical history, social history, family history, and environmental history and no significant changes have been reported from her previous visit.  ROS: All others negative except as noted per HPI.   Objective: BP (!) 140/76   Pulse 89   Temp 98.1 F (36.7 C)   Resp 16   Ht 5' (1.524 m)   Wt 164 lb 6.4 oz (74.6 kg)   SpO2 97%   BMI 32.11 kg/m  Body mass index is 32.11 kg/m. General Appearance:  Alert, cooperative, no distress, appears stated age  Head:  Normocephalic, without obvious abnormality, atraumatic, mild swelling on right cheek   Eyes:  Conjunctiva clear, EOM's intact  Nose: Nares normal,  erythematous and edematous nasal mucosa, no visible anterior polyps, and septum midline  Throat: Lips, tongue normal; teeth and gums normal, cobblestoning present  Neck: Supple, symmetrical  Lungs:   clear to auscultation bilaterally, Respirations unlabored, no coughing  Heart:  regular rate and rhythm and no murmur, Appears well perfused  Extremities: No edema  Skin: Skin color, texture, turgor normal, no rashes or lesions on visualized portions of skin  Neurologic: No gross  deficits   Previous notes and tests were reviewed. The plan was reviewed with the patient/family, and all questions/concerned were addressed.  It was my pleasure to see Laurajean today and participate in her care. Please feel free to contact me with any questions or concerns.  Sincerely,  Ferol Luz, MD  Allergy & Immunology  [facility] Office: [phone]

## 2023-02-16 MED ORDER — FLUTICASONE PROPIONATE 50 MCG/ACT NA SUSP: 1.0000 | Freq: Two times a day (BID) | NASAL | 2 refills | Status: DC

## 2023-03-07 DIAGNOSIS — K080 Exfoliation of teeth due to systemic causes: Secondary | ICD-10-CM | POA: Diagnosis not present

## 2023-03-31 DIAGNOSIS — K080 Exfoliation of teeth due to systemic causes: Secondary | ICD-10-CM | POA: Diagnosis not present

## 2023-04-11 ENCOUNTER — Encounter: Payer: Self-pay | Admitting: Internal Medicine

## 2023-04-11 ENCOUNTER — Ambulatory Visit: Payer: Medicare Other | Admitting: Internal Medicine

## 2023-04-11 VITALS — BP 152/72 | HR 87 | Temp 98.7°F | Resp 18

## 2023-04-11 DIAGNOSIS — Z9889 Other specified postprocedural states: Secondary | ICD-10-CM | POA: Diagnosis not present

## 2023-04-11 DIAGNOSIS — J3089 Other allergic rhinitis: Secondary | ICD-10-CM

## 2023-04-11 DIAGNOSIS — J4540 Moderate persistent asthma, uncomplicated: Secondary | ICD-10-CM | POA: Diagnosis not present

## 2023-04-11 DIAGNOSIS — K219 Gastro-esophageal reflux disease without esophagitis: Secondary | ICD-10-CM | POA: Diagnosis not present

## 2023-04-11 DIAGNOSIS — R030 Elevated blood-pressure reading, without diagnosis of hypertension: Secondary | ICD-10-CM

## 2023-04-11 NOTE — Progress Notes (Unsigned)
Follow Up Note  RE: Gina Estrada MRN: [id number] DOB: 01-12-1946 Date of Office Visit: 04/11/2023  Referring provider: Tarri Fuller, MD Primary care provider: Tarri Fuller, MD  Chief Complaint: Asthma  History of Present Illness: I had the pleasure of seeing Gina Estrada for a follow up visit at the [facility] [place] on 04/12/2023. She is a 77 y.o. female, who is being followed for persistent asthma, rhinitis, . Her previous allergy office visit was on 02/14/23  with Dr. Marlynn Perking. Today is a regular follow up visit.  History obtained from patient, chart review.  Today she reports  Symptoms have dramatically improved.  She has been able to rejoin the church choir.  Taking Symbicort 2 puffs twice daily and rinsing her mouth.  No adverse effects.  No antibiotics or steroids since last visit. Feels like upper airway symptoms are well-controlled on Flonase twice daily, Zyrtec daily.  Sinus rinses added at last visit which significantly improved postnasal drip.  She has not needed any Mucinex.  Of note she has been having 2 cracked crowns and significant dental pain.  She has been taking 600 mg of ibuprofen every 6 hours.  Her blood pressure was elevated today and she is wondering if it is due to her ibuprofen use.  Pertinent History/Diagnostics:  - Asthma: persistent, stepped up to Trelegy 1/24  - Depomedrol and prednisone given for layngitis 12/23             - mixed obstructive/restrictive spirometry (10/04/22): ratio 90, 1.20L, 55% FEV1 (pre), no post-BD change  Assessment and Plan: Gina Estrada is a 77 y.o. female with: Moderate persistent asthma without complication  Other allergic rhinitis  Gastroesophageal reflux disease without esophagitis  History of recent dental procedure  Elevated blood pressure reading   Plan: Patient Instructions  Moderate Persistent  Asthma: improved Will update spirometry at next visit.  If well-controlled may consider  stepping down Symbicort.  PLAN:  - Spacer sample and demonstration provided. - Daily controller medication(s):  Symbicort 160 mcg 2 puffs twice daily  - Prior to physical activity: albuterol 2 puffs 10-15 minutes before physical activity. - Rescue medications: albuterol 4 puffs every 4-6 hours as needed - Changes during respiratory infections or worsening symptoms: Increase Symbicort to 4 puffs twice daily for TWO WEEKS. - Asthma control goals:  * Full participation in all desired activities (may need albuterol before activity) * Albuterol use two time or less a week on average (not counting use with activity) * Cough interfering with sleep two time or less a month * Oral steroids no more than once a year * No hospitalizations  Chronic  Rhinitis: well controlled  - Continue Floanse 1 spray per nostril twice daily (use after sinus rinses )  - Consider Zyrtec 10mg  daily, can add an extra dose for breakthrough symptoms  - Continue Sinus rinses twice daily prior to nasal sprays to improve how well they work  - Can continue mucinex DM, but make sure you stay hydrated   Recommend stopping ibuprofen as this may be contributing to elevated blood pressure.  Recheck at home.  If he continues to stay elevated this may be due to dental pain and recommend talking to dentist about alternative pain control.  Follow up: 6 months   Thank you so much for letting me partake in your care today.  Don't hesitate to reach out if you have any additional concerns!  Ferol Luz, MD  [facility] [place], [place]  No orders of the defined types were placed in this encounter.   Lab Orders  No laboratory test(s) ordered today   Diagnostics: Spirometry:  Tracings reviewed. Her effort: Good reproducible efforts. FVC: 1.53L FEV1: 1.34L, 64% predicted FEV1/FVC ratio: 88% Interpretation: Spirometry consistent with mixed obstructive and restrictive disease.  Please see scanned spirometry  results for details.   Medication List:  Current Outpatient Medications  Medication Sig Dispense Refill   albuterol (VENTOLIN HFA) 108 (90 Base) MCG/ACT inhaler Inhale into the lungs.     budesonide-formoterol (SYMBICORT) 160-4.5 MCG/ACT inhaler Inhale 2 puffs into the lungs 2 (two) times daily. 1 each 6   EPINEPHrine 0.3 mg/0.3 mL IJ SOAJ injection Inject into the muscle.     fluticasone (FLONASE) 50 MCG/ACT nasal spray Place 1 spray into both nostrils in the morning and at bedtime. 16 g 2   No current facility-administered medications for this visit.   Allergies: Allergies  Allergen Reactions   Aspirin Other (See Comments)    Asthma Exacerbations   Montelukast     SLEEP ISSUE   Penicillins Hives and Itching   Sulfa Antibiotics Hives and Itching   Codeine Nausea And Vomiting   I reviewed her past medical history, social history, family history, and environmental history and no significant changes have been reported from her previous visit.  ROS: All others negative except as noted per HPI.   Objective: BP (!) 152/72   Pulse 87   Temp 98.7 F (37.1 C) (Temporal)   Resp 18   SpO2 95%  There is no height or weight on file to calculate BMI. General Appearance:  Alert, cooperative, no distress, appears stated age  Head:  Normocephalic, without obvious abnormality, atraumatic, mild swelling on right cheek   Eyes:  Conjunctiva clear, EOM's intact  Nose: Nares normal,  erythematous and edematous nasal mucosa, no visible anterior polyps, and septum midline  Throat: Lips, tongue normal; teeth and gums normal, cobblestoning present  Neck: Supple, symmetrical  Lungs:   clear to auscultation bilaterally, Respirations unlabored, no coughing  Heart:  regular rate and rhythm and no murmur, Appears well perfused  Extremities: No edema  Skin: Skin color, texture, turgor normal, no rashes or lesions on visualized portions of skin  Neurologic: No gross deficits   Previous notes and tests  were reviewed. The plan was reviewed with the patient/family, and all questions/concerned were addressed.  It was my pleasure to see Gina Estrada today and participate in her care. Please feel free to contact me with any questions or concerns.  Sincerely,  Ferol Luz, MD  Allergy & Immunology  [facility] Office: [phone]

## 2023-04-11 NOTE — Patient Instructions (Signed)
Moderate Persistent  Asthma: improved Will update spirometry at next visit.  If well-controlled may consider stepping down Symbicort.  PLAN:  - Spacer sample and demonstration provided. - Daily controller medication(s):  Symbicort 160 mcg 2 puffs twice daily  - Prior to physical activity: albuterol 2 puffs 10-15 minutes before physical activity. - Rescue medications: albuterol 4 puffs every 4-6 hours as needed - Changes during respiratory infections or worsening symptoms: Increase Symbicort to 4 puffs twice daily for TWO WEEKS. - Asthma control goals:  * Full participation in all desired activities (may need albuterol before activity) * Albuterol use two time or less a week on average (not counting use with activity) * Cough interfering with sleep two time or less a month * Oral steroids no more than once a year * No hospitalizations  Chronic  Rhinitis: well controlled  - Continue Floanse 1 spray per nostril twice daily (use after sinus rinses )  - Consider Zyrtec 10mg  daily, can add an extra dose for breakthrough symptoms  - Continue Sinus rinses twice daily prior to nasal sprays to improve how well they work  - Can continue mucinex DM, but make sure you stay hydrated   Recommend stopping ibuprofen as this may be contributing to elevated blood pressure.  Recheck at home.  If he continues to stay elevated this may be due to dental pain and recommend talking to dentist about alternative pain control.  Follow up: 6 months   Thank you so much for letting me partake in your care today.  Don't hesitate to reach out if you have any additional concerns!  Ferol Luz, MD  [facility] [place], [place]

## 2023-04-25 DIAGNOSIS — K080 Exfoliation of teeth due to systemic causes: Secondary | ICD-10-CM | POA: Diagnosis not present

## 2023-04-27 DIAGNOSIS — K080 Exfoliation of teeth due to systemic causes: Secondary | ICD-10-CM | POA: Diagnosis not present

## 2023-10-03 ENCOUNTER — Ambulatory Visit: Payer: Medicare Other | Admitting: Internal Medicine

## 2023-10-03 ENCOUNTER — Encounter: Payer: Self-pay | Admitting: Internal Medicine

## 2023-10-03 VITALS — BP 140/80 | HR 82 | Temp 98.7°F | Resp 18

## 2023-10-03 DIAGNOSIS — J3089 Other allergic rhinitis: Secondary | ICD-10-CM | POA: Diagnosis not present

## 2023-10-03 DIAGNOSIS — J4540 Moderate persistent asthma, uncomplicated: Secondary | ICD-10-CM | POA: Diagnosis not present

## 2023-10-03 DIAGNOSIS — K219 Gastro-esophageal reflux disease without esophagitis: Secondary | ICD-10-CM | POA: Diagnosis not present

## 2023-10-03 MED ORDER — ALBUTEROL SULFATE HFA 108 (90 BASE) MCG/ACT IN AERS: 2.0000 | INHALATION_SPRAY | RESPIRATORY_TRACT | 5 refills | Status: DC | PRN

## 2023-10-03 NOTE — Progress Notes (Signed)
Follow Up Note  RE: Gina Estrada MRN: [id number] DOB: [birth year] Date of Office Visit: 10/03/2023  Referring provider: Tarri Fuller, MD Primary care provider: Tarri Fuller, MD  Chief Complaint: Follow-up and Asthma  History of Present Illness: I had the pleasure of seeing Gina Estrada for a follow up visit at the [facility] on 10/07/2023. She is a 77 y.o. female, who is being followed for persistent asthma, rhinitis, . Her previous allergy office visit was on 04/11/23  with Dr. Marlynn Perking. Today is a regular follow up visit.  History obtained from patient, chart review.  Today she reports  The patient, with a known history of respiratory issues managed with Symbicort and Albuterol, presents with exacerbated breathing difficulties. These difficulties are particularly noticeable when the patient is cleaning out their late sister's house, which is reportedly filled with dust and potential mold and mildew. The patient also notes that the house had a moisture problem and a pet cat, both of which could be contributing to their respiratory symptoms. Despite these challenges, the patient's symptoms typically resolve within a few hours of leaving the house.  The patient also reports occasional breakthrough symptoms of runny and stuffy nose, and itchy, watery eyes, particularly when around their grandchildren. The patient is on Zyrtec and Flonase, but admits to not taking these regularly. Despite these occasional symptoms, the patient's breathing test results have remained stable.  In addition to their respiratory issues, the patient is dealing with the stress and logistical challenges of managing their late sister's estate, which includes cleaning and selling the house. This has been a significant source of stress for the patient, further complicated by the fact that their other sister, who was named executor of the will, has been unable to fulfill her duties due to her own  health issues.   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 female with: Moderate persistent asthma without complication - Plan: Spirometry with Graph  Other allergic rhinitis  Gastroesophageal reflux disease without esophagitis   Plan: Patient Instructions  Moderate Persistent  Asthma: controlled  Spirometry: at baseline  Recommend pre-treating prior to going to your sisters house with albuterol 2 puffs and zyrtec 10mg  daily   PLAN:  - Spacer sample and demonstration provided. - Daily controller medication(s):  Symbicort 160 mcg 2 puffs twice daily  - Prior to physical activity: albuterol 2 puffs 10-15 minutes before physical activity. - Rescue medications: albuterol 4 puffs every 4-6 hours as needed - Changes during respiratory infections or worsening symptoms: Increase Symbicort to 4 puffs twice daily for TWO WEEKS. - Asthma control goals:  * Full participation in all desired activities (may need albuterol before activity) * Albuterol use two time or less a week on average (not counting use with activity) * Cough interfering with sleep two time or less a month * Oral steroids no more than once a year * No hospitalizations  Chronic  Rhinitis: well controlled  - Continue Floanse 1 spray per nostril twice daily (use after sinus rinses )  - Consider Zyrtec 10mg  daily, can add an extra dose for breakthrough symptoms  - Continue Sinus rinses twice daily prior to nasal sprays  to improve how well they work   Follow up: 6 months   Thank you so much for letting me partake in your care today.  Don't hesitate to reach out if you have any additional concerns!  Ferol Luz, MD  [facility] [place] [place]   Meds ordered this encounter  Medications   albuterol (VENTOLIN HFA) 108 (90 Base) MCG/ACT inhaler    Sig: Inhale 2 puffs into the lungs every 4 (four) hours as needed for wheezing or shortness of breath.    Dispense:  1 each    Refill:  5    Lab Orders  No laboratory test(s) ordered today   Diagnostics: Spirometry:  Tracings reviewed. Her effort: Good reproducible efforts. FVC: 1.59L FEV1: 1.37L, 66% predicted FEV1/FVC ratio: 86% Interpretation: Spirometry consistent with mixed obstructive and restrictive disease.  Please see scanned spirometry results for details.   Medication List:  Current Outpatient Medications  Medication Sig Dispense Refill   albuterol (VENTOLIN HFA) 108 (90 Base) MCG/ACT inhaler Inhale into the lungs.     albuterol (VENTOLIN HFA) 108 (90 Base) MCG/ACT inhaler Inhale 2 puffs into the lungs every 4 (four) hours as needed for wheezing or shortness of breath. 1 each 5   budesonide-formoterol (SYMBICORT) 160-4.5 MCG/ACT inhaler Inhale 2 puffs into the lungs 2 (two) times daily. 1 each 6   EPINEPHrine 0.3 mg/0.3 mL IJ SOAJ injection Inject into the muscle.     fluticasone (FLONASE) 50 MCG/ACT nasal spray Place 1 spray into both nostrils in the morning and at bedtime. 16 g 2   No current facility-administered medications for this visit.   Allergies: Allergies  Allergen Reactions   Aspirin Other (See Comments)    Asthma Exacerbations   Montelukast     SLEEP ISSUE   Penicillins Hives and Itching   Sulfa Antibiotics Hives and Itching   Codeine Nausea And Vomiting   I reviewed her past medical history, social history, family history, and environmental history and no significant changes have been reported from her previous visit.  ROS: All others negative except as noted per HPI.   Objective: BP (!) 140/80   Pulse 82   Temp 98.7 F (37.1 C) (Temporal)   Resp 18   SpO2 98%  There is no height or weight on file to  calculate BMI. General Appearance:  Alert, cooperative, no distress, appears stated age  Head:  Normocephalic, without obvious abnormality, atraumatic, mild swelling on right cheek   Eyes:  Conjunctiva clear, EOM's intact  Nose: Nares normal, hypertrophic turbinates, normal mucosa, no visible anterior polyps, and septum midline  Throat: Lips, tongue normal; teeth and gums normal, cobblestoning present  Neck: Supple, symmetrical  Lungs:   clear to auscultation bilaterally, Respirations unlabored, no coughing  Heart:  regular rate and rhythm and no murmur, Appears well perfused  Extremities: No edema  Skin: Skin color, texture, turgor normal, no rashes or lesions on visualized portions of skin  Neurologic: No gross deficits   Previous notes and tests were reviewed. The plan was reviewed with the patient/family, and all questions/concerned were addressed.  It was my pleasure to see Gina Estrada today and participate in her care. Please feel free to contact me with any questions or concerns.  Sincerely,  Ferol Luz, MD  Allergy & Immunology  [facility] Office: [phone]

## 2023-10-03 NOTE — Patient Instructions (Addendum)
Moderate Persistent  Asthma: controlled  Spirometry: at baseline  Recommend pre-treating prior to going to your sisters house with albuterol 2 puffs and zyrtec 10mg  daily   PLAN:  - Spacer sample and demonstration provided. - Daily controller medication(s):  Symbicort 160 mcg 2 puffs twice daily  - Prior to physical activity: albuterol 2 puffs 10-15 minutes before physical activity. - Rescue medications: albuterol 4 puffs every 4-6 hours as needed - Changes during respiratory infections or worsening symptoms: Increase Symbicort to 4 puffs twice daily for TWO WEEKS. - Asthma control goals:  * Full participation in all desired activities (may need albuterol before activity) * Albuterol use two time or less a week on average (not counting use with activity) * Cough interfering with sleep two time or less a month * Oral steroids no more than once a year * No hospitalizations  Chronic  Rhinitis: well controlled  - Continue Floanse 1 spray per nostril twice daily (use after sinus rinses )  - Consider Zyrtec 10mg  daily, can add an extra dose for breakthrough symptoms  - Continue Sinus rinses twice daily prior to nasal sprays to improve how well they work   Follow up: 6 months   Thank you so much for letting me partake in your care today.  Don't hesitate to reach out if you have any additional concerns!  Ferol Luz, MD  [facility] [place] [place]

## 2023-12-23 ENCOUNTER — Other Ambulatory Visit: Payer: Self-pay | Admitting: Internal Medicine

## 2024-04-02 ENCOUNTER — Ambulatory Visit: Payer: Medicare Other | Admitting: Internal Medicine

## 2024-04-02 ENCOUNTER — Encounter: Payer: Self-pay | Admitting: Internal Medicine

## 2024-04-02 VITALS — BP 148/64 | HR 89 | Temp 97.2°F | Resp 17 | Wt 162.0 lb

## 2024-04-02 DIAGNOSIS — R030 Elevated blood-pressure reading, without diagnosis of hypertension: Secondary | ICD-10-CM | POA: Diagnosis not present

## 2024-04-02 DIAGNOSIS — K219 Gastro-esophageal reflux disease without esophagitis: Secondary | ICD-10-CM

## 2024-04-02 DIAGNOSIS — J4540 Moderate persistent asthma, uncomplicated: Secondary | ICD-10-CM | POA: Diagnosis not present

## 2024-04-02 DIAGNOSIS — J3089 Other allergic rhinitis: Secondary | ICD-10-CM | POA: Diagnosis not present

## 2024-04-02 NOTE — Progress Notes (Unsigned)
 Follow Up Note  RE: Gina Estrada MRN: [id number] DOB: 1946/06/21 Date of Office Visit: 04/02/2024  Referring provider: Luevenia Saha, MD Primary care provider: Luevenia Saha, MD  Chief Complaint: Follow-up (Pt states she's been doing okay.)  History of Present Illness: I had the pleasure of seeing Gina Estrada for a follow up visit at the [facility] on 04/03/2024. She is a 78 y.o. female, who is being followed for persistent asthma, rhinitis, . Her previous allergy office visit was on 10/03/23  with Dr. Jolayne Natter. Today is a regular follow up visit.  History obtained from patient, chart review.  Today she reports  Discussed the use of AI scribe software for clinical note transcription with the patient, who gave verbal consent to proceed.  History of Present Illness Gina Estrada is a 78 year old female who presents for follow-up of her respiratory symptoms.  Since her last visit in December, she has been managing her respiratory symptoms well with Albuterol  and Zyrtec  as needed. She continues to use Symbicort , two puffs twice daily, and has not required prednisone  or other steroids. Despite experiencing the worst allergy season in [place] , she reports doing well, with only occasional hoarseness lasting for an hour or two, often triggered by environmental changes such as getting in and out of hot cars.  She occasionally uses Flonase  and Zyrtec , particularly when around her grandchildren, who often have colds. She experiences a stuffy nose once in a while, which usually resolves within a day. Her grandchildren's frequent illnesses sometimes affect her health.  She has sold her sisters home and no longer has to deal with that environment which was triggering lower respiratory symptoms at last visit   Pertinent History/Diagnostics:  - Asthma: persistent, stepped up to Trelegy 1/24, stepped back to symbicort  6/24   - Depomedrol and prednisone  given  for layngitis 12/23             - mixed obstructive/restrictive spirometry (10/04/22): ratio 90, 1.20L, 55% FEV1 (pre), no post-BD change  Assessment and Plan: Roselie is a 78 y.o. female with: Moderate persistent asthma without complication  Other allergic rhinitis  Gastroesophageal reflux disease without esophagitis  Elevated blood pressure reading   Plan: Patient Instructions  Moderate Persistent  Asthma: controlled   PLAN:  - Spacer sample and demonstration provided. - Daily controller medication(s): Symbicort  160 mcg 2 puffs twice daily  - Prior to physical activity: albuterol  2 puffs 10-15 minutes before physical activity. - Rescue medications: albuterol  4 puffs every 4-6 hours as needed - Changes during respiratory infections or worsening symptoms: Increase Symbicort  to 4 puffs twice daily for TWO WEEKS. - Asthma control goals:  * Full participation in all desired activities (may need albuterol  before activity) * Albuterol  use two time or less a week on average (not counting use with activity) * Cough interfering with sleep two time or less a month * Oral steroids no more than once a year * No hospitalizations  Chronic  Rhinitis: well controlled  - Continue Floanse 1 spray per nostril twice daily (use after sinus rinses )  - Consider Zyrtec  10mg  daily, can add an extra dose for breakthrough symptoms  - Continue Sinus rinses twice daily prior to nasal sprays to improve how well they work   Follow up: 6 months   Thank you so much for letting me partake in your care today.  Don't hesitate to reach out if you have any additional concerns!  Gina Binet, MD  [facility] [place] [place]   Meds ordered this encounter  Medications   albuterol  (VENTOLIN  HFA) 108 (90 Base) MCG/ACT inhaler    Sig: Inhale 2 puffs into the lungs every 4 (four) hours as needed for wheezing or shortness of breath.    Dispense:  1 each    Refill:  5   budesonide -formoterol   (BREYNA ) 160-4.5 MCG/ACT inhaler    Sig: Inhale 2 puffs into the lungs 2 (two) times daily.    Dispense:  10.3 g    Refill:  5    Lab Orders  No laboratory test(s) ordered today   Diagnostics: None done    Medication List:  Current Outpatient Medications  Medication Sig Dispense Refill   albuterol  (VENTOLIN  HFA) 108 (90 Base) MCG/ACT inhaler Inhale into the lungs.     albuterol  (VENTOLIN  HFA) 108 (90 Base) MCG/ACT inhaler Inhale 2 puffs into the lungs every 4 (four) hours as needed for wheezing or shortness of breath. 1 each 5   budesonide -formoterol  (BREYNA ) 160-4.5 MCG/ACT inhaler Inhale 2 puffs into the lungs 2 (two) times daily. 10.3 g 5   EPINEPHrine  0.3 mg/0.3 mL IJ SOAJ injection Inject into the muscle.     fluticasone  (FLONASE ) 50 MCG/ACT nasal spray Place 1 spray into both nostrils in the morning and at bedtime. 16 g 2   No current facility-administered medications for this visit.   Allergies: Allergies  Allergen Reactions   Aspirin Other (See Comments)    Asthma Exacerbations   Montelukast      SLEEP ISSUE   Penicillins Hives and Itching   Sulfa Antibiotics Hives and Itching   Codeine Nausea And Vomiting   I reviewed her past medical history, social history, family history, and environmental history and no significant changes have been reported from her previous visit.  ROS: All others negative except as noted per HPI.   Objective: BP (!) 148/64   Pulse 89   Temp (!) 97.2 F (36.2 C) (Temporal)   Resp 17   Wt 162 lb (73.5 kg)   SpO2 97%   BMI 31.64 kg/m  Body mass index is 31.64 kg/m. General Appearance:  Alert, cooperative, no distress, appears stated age  Head:  Normocephalic, without obvious abnormality, atraumatic, mild swelling on right cheek   Eyes:  Conjunctiva clear, EOM's intact  Nose: Nares normal, hypertrophic turbinates, normal mucosa, no visible anterior polyps, and septum midline  Throat: Lips, tongue normal; teeth and gums normal,  cobblestoning present  Neck: Supple, symmetrical  Lungs:   clear to auscultation bilaterally, Respirations unlabored, no coughing  Heart:  regular rate and rhythm and no murmur, Appears well perfused  Extremities: No edema  Skin: Skin color, texture, turgor normal, no rashes or lesions on visualized portions of skin  Neurologic: No gross deficits   Previous notes and tests were reviewed. The plan was reviewed with the patient/family, and all questions/concerned were addressed.  It was my pleasure to see Gina Estrada today and participate in her care. Please feel free to contact me with any questions or concerns.  Sincerely,  Gina Binet, MD  Allergy & Immunology  [facility]   [place] Office: [phone]

## 2024-04-02 NOTE — Patient Instructions (Signed)
 Moderate Persistent  Asthma: controlled  Will update spiro at next visit   PLAN:  - Spacer sample and demonstration provided. - Daily controller medication(s): Symbicort  160 mcg 2 puffs twice daily  - Prior to physical activity: albuterol  2 puffs 10-15 minutes before physical activity. - Rescue medications: albuterol  4 puffs every 4-6 hours as needed - Changes during respiratory infections or worsening symptoms: Increase Symbicort  to 4 puffs twice daily for TWO WEEKS. - Asthma control goals:  * Full participation in all desired activities (may need albuterol  before activity) * Albuterol  use two time or less a week on average (not counting use with activity) * Cough interfering with sleep two time or less a month * Oral steroids no more than once a year * No hospitalizations  Chronic  Rhinitis: well controlled  - Continue Floanse 1 spray per nostril twice daily (use after sinus rinses )  - Consider Zyrtec  10mg  daily, can add an extra dose for breakthrough symptoms  - Continue Sinus rinses twice daily prior to nasal sprays to improve how well they work   Follow up: 6 months   Thank you so much for letting me partake in your care today.  Don't hesitate to reach out if you have any additional concerns!  Orelia Binet, MD  [facility] [place] [place]

## 2024-04-03 MED ORDER — ALBUTEROL SULFATE HFA 108 (90 BASE) MCG/ACT IN AERS: 2.0000 | INHALATION_SPRAY | RESPIRATORY_TRACT | 5 refills | Status: DC | PRN

## 2024-04-03 MED ORDER — BUDESONIDE-FORMOTEROL FUMARATE 160-4.5 MCG/ACT IN AERO: 2.0000 | INHALATION_SPRAY | Freq: Two times a day (BID) | RESPIRATORY_TRACT | 5 refills | Status: DC

## 2024-08-28 ENCOUNTER — Encounter: Payer: Self-pay | Admitting: Family Medicine

## 2024-08-28 ENCOUNTER — Ambulatory Visit: Admitting: Family Medicine

## 2024-08-28 VITALS — BP 136/74 | HR 72 | Temp 98.7°F | Resp 20 | Ht 61.0 in | Wt 164.0 lb

## 2024-08-28 DIAGNOSIS — J4541 Moderate persistent asthma with (acute) exacerbation: Secondary | ICD-10-CM | POA: Diagnosis not present

## 2024-08-28 DIAGNOSIS — K219 Gastro-esophageal reflux disease without esophagitis: Secondary | ICD-10-CM | POA: Diagnosis not present

## 2024-08-28 DIAGNOSIS — J3089 Other allergic rhinitis: Secondary | ICD-10-CM

## 2024-08-28 DIAGNOSIS — R051 Acute cough: Secondary | ICD-10-CM | POA: Diagnosis not present

## 2024-08-28 MED ORDER — BENZONATATE 100 MG PO CAPS: 100.0000 mg | ORAL_CAPSULE | Freq: Three times a day (TID) | ORAL | 0 refills | Status: AC | PRN

## 2024-08-28 MED ORDER — BUDESONIDE-FORMOTEROL FUMARATE 160-4.5 MCG/ACT IN AERO: 2.0000 | INHALATION_SPRAY | Freq: Two times a day (BID) | RESPIRATORY_TRACT | 5 refills | Status: DC

## 2024-08-28 MED ORDER — PREDNISONE 10 MG PO TABS: ORAL_TABLET | ORAL | 0 refills | Status: AC

## 2024-08-28 NOTE — Progress Notes (Signed)
 [address] [place] [address] Dept: [phone]  FOLLOW UP NOTE  Patient ID: Gina Estrada, female    DOB: 05/16/1946  Age: 78 y.o. MRN: [id number] Date of Office Visit: 08/28/2024  Assessment  Chief Complaint: Cough  HPI Gina Estrada is a 78 year old female who presents to the clinic for evaluation of acute cough.  She was last seen in this clinic on 04/02/2024 by Dr. Lorin for evaluation of asthma, chronic rhinitis, and reflux.  Discussed the use of AI scribe software for clinical note transcription with the patient, who gave verbal consent to proceed.  History of Present Illness Gina Estrada is a 78 year old female who presents with a persistent cough and shortness of breath.  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 The cough is severe enough to cause her to lose her breath and produces thick mucus, which is sometimes green and sometimes white.  She experiences shortness of breath over the last several weeks. She has not noticed any wheezing herself. She uses an albuterol  inhaler frequently, up to four times a day, over the past ten days, but finds it provides minimal relief an she reports she can not retain the medication in her lungs due to cough. She also uses a Breyna  160 inhaler 2 puffs twice daily, although her insurance does not favor this medication, resulting in high out-of-pocket costs.  Allergy symptoms include runny and stuffy nose, and sneezing, which she attributes to the timber cutting. She has tried Zyrtec  without relief and discontinued Mucinex DM after it worsened her symptoms in terms of additional post nasal drainage. She is not currently using a nasal saline rinse or nasal steroid spray. .   She takes omeprazole for reflux symptoms, which she uses as needed rather than daily. She reports that she takes omeprazole 20 mg as needed after she  begins to feel heartburn. We discussed the mechanism of action of omeprazole in detail.   Over the last week, she reports intermittent ear pain with a pulsing sensation, but no drainage or hearing changes. She denies recent popping in her ears.   Her current medications are listed in the chart.    Drug Allergies:  Allergies  Allergen Reactions   Aspirin Other (See Comments)    Asthma Exacerbations   Montelukast      SLEEP ISSUE   Penicillins Hives and Itching   Sulfa Antibiotics Hives and Itching   Codeine Nausea And Vomiting    Physical Exam: BP 136/74 (BP Location: Left Arm, Patient Position: Sitting, Cuff Size: Normal)   Pulse 72   Temp 98.7 F (37.1 C) (Temporal)   Resp 20   Ht 5' 1 (1.549 m)   Wt 164 lb (74.4 kg)   SpO2 96%   BMI 30.99 kg/m    Physical Exam Vitals reviewed.  Constitutional:      Appearance: Normal appearance.  HENT:     Head: Normocephalic and atraumatic.     Right Ear: Tympanic membrane normal.     Left Ear: Tympanic membrane normal.     Nose:     Comments: Bilateral nares slightly erythematous with thick clear nasal drainage. Pharynx slightly erythemaotus with no exudate. Ears normal. Eyes normal. Cardiovascular:     Rate and Rhythm: Normal rate and regular rhythm.     Heart sounds: Normal heart sounds. No murmur heard. Pulmonary:  Effort: Pulmonary effort is normal.     Breath sounds: Normal breath sounds.     Comments: Lungs clear to auscultation Musculoskeletal:        General: Normal range of motion.     Cervical back: Normal range of motion and neck supple.  Skin:    General: Skin is warm and dry.  Neurological:     Mental Status: She is alert and oriented to person, place, and time.  Psychiatric:        Mood and Affect: Mood normal.        Behavior: Behavior normal.        Thought Content: Thought content normal.        Judgment: Judgment normal.     Diagnostics: FVC 1.47 which is 63% of predicted value, FEV1 1.27 which  is 71% of predicted value.  Spirometry indicates normal ventilatory function.  Assessment and Plan: 1. Moderate persistent asthma with acute exacerbation   2. Acute cough   3. Other allergic rhinitis   4. Gastroesophageal reflux disease, unspecified whether esophagitis present     Meds ordered this encounter  Medications   predniSONE  (DELTASONE ) 10 MG tablet    Sig: Begin prednisone  10 mg tablets. Take 2 tablets twice a day for 3 days, then take 2 tablets once a day for 1 day, then take 1 tablet on the 5th day, then stop    Dispense:  15 tablet    Refill:  0   benzonatate (TESSALON) 100 MG capsule    Sig: Take 1 capsule (100 mg total) by mouth 3 (three) times daily as needed for cough.    Dispense:  20 capsule    Refill:  0   budesonide -formoterol  (BREYNA ) 160-4.5 MCG/ACT inhaler    Sig: Inhale 2 puffs into the lungs 2 (two) times daily.    Dispense:  10.3 g    Refill:  5    Patient Instructions  Asthma Continue Breyna  160-2 puffs twice a day Continue albuterol  2 puffs every 4 hours as needed for cough or wheeze OR Instead use albuterol  0.083% solution via nebulizer one unit vial every 4 hours as needed for cough or wheeze Continue albuterol  2 puffs 5-15 minutes before activity to decrease cough or wheeze  Reflux Continue dietary and lifestyle modifications as listed below Begin omeprazole 20 mg once a day for the next 2-4 weeks. Take this medication 30-60 minutes before your first meal for best results  Chronic rhinitis Begin nasal saline rinses Begin Flonase  2 sprays in each nostril once a day for a stuffy nose Begin azelastine 2 sprays in each nostril twice a day for a runny nose Consider updating your allergy testing when you are feeling better  Acute cough Multi-factorial Continue the treatment plan for asthma, rhinitis, and reflux as listed above Begin prednisone  10 mg tablets. Take 2 tablets twice a day for 3 days, then take 2 tablets once a day for 1 day, then take  1 tablet on the 5th day, then stop  Begin tessalon Perles up to three times a day if needed Begin Mucinex [phone] mg twice a day and increase fluid intake as tolerated  Call the clinic if this treatment plan is not working well for you  Follow up in 2 months or sooner if needed.    Return in about 2 months (around 10/28/2024), or if symptoms worsen or fail to improve.    Thank you for the opportunity to care for this patient.  Please do not  hesitate to contact me with questions.  Arlean Mutter, FNP [facility] 

## 2024-08-28 NOTE — Patient Instructions (Addendum)
 Asthma Continue Breyna  160-2 puffs twice a day Continue albuterol  2 puffs every 4 hours as needed for cough or wheeze OR Instead use albuterol  0.083% solution via nebulizer one unit vial every 4 hours as needed for cough or wheeze Continue albuterol  2 puffs 5-15 minutes before activity to decrease cough or wheeze  Reflux Continue dietary and lifestyle modifications as listed below Begin omeprazole 20 mg once a day for the next 2-4 weeks. Take this medication 30-60 minutes before your first meal for best results  Chronic rhinitis Begin nasal saline rinses Begin Flonase  2 sprays in each nostril once a day for a stuffy nose Begin azelastine 2 sprays in each nostril twice a day for a runny nose Consider updating your allergy testing when you are feeling better  Acute cough Multi-factorial Continue the treatment plan for asthma, rhinitis, and reflux as listed above Begin prednisone  10 mg tablets. Take 2 tablets twice a day for 3 days, then take 2 tablets once a day for 1 day, then take 1 tablet on the 5th day, then stop  Begin tessalon Perles up to three times a day if needed Begin Mucinex [phone] mg twice a day and increase fluid intake as tolerated  Call the clinic if this treatment plan is not working well for you  Follow up in 2 months or sooner if needed.

## 2024-08-29 ENCOUNTER — Telehealth: Payer: Self-pay | Admitting: Internal Medicine

## 2024-08-29 MED ORDER — ALBUTEROL SULFATE (2.5 MG/3ML) 0.083% IN NEBU: 2.5000 mg | INHALATION_SOLUTION | RESPIRATORY_TRACT | 1 refills | Status: DC | PRN

## 2024-08-29 NOTE — Telephone Encounter (Signed)
 Pt informed albuterol  solution sent to [facility].

## 2024-08-29 NOTE — Telephone Encounter (Signed)
 PT STATES SHE RECEIVED A NEBULIZER BUT NO MEDS TO GO WITH IT.

## 2024-10-02 ENCOUNTER — Encounter: Payer: Self-pay | Admitting: Family Medicine

## 2024-10-02 ENCOUNTER — Other Ambulatory Visit: Payer: Self-pay | Admitting: Family Medicine

## 2024-10-02 ENCOUNTER — Ambulatory Visit: Admitting: Family Medicine

## 2024-10-02 ENCOUNTER — Other Ambulatory Visit: Payer: Self-pay

## 2024-10-02 VITALS — BP 122/62 | HR 92 | Temp 97.3°F

## 2024-10-02 DIAGNOSIS — B9689 Other specified bacterial agents as the cause of diseases classified elsewhere: Secondary | ICD-10-CM

## 2024-10-02 DIAGNOSIS — J3089 Other allergic rhinitis: Secondary | ICD-10-CM

## 2024-10-02 DIAGNOSIS — K219 Gastro-esophageal reflux disease without esophagitis: Secondary | ICD-10-CM

## 2024-10-02 DIAGNOSIS — J4541 Moderate persistent asthma with (acute) exacerbation: Secondary | ICD-10-CM

## 2024-10-02 DIAGNOSIS — J019 Acute sinusitis, unspecified: Secondary | ICD-10-CM | POA: Diagnosis not present

## 2024-10-02 DIAGNOSIS — R051 Acute cough: Secondary | ICD-10-CM

## 2024-10-02 MED ORDER — BUDESONIDE-FORMOTEROL FUMARATE 160-4.5 MCG/ACT IN AERO: 2.0000 | INHALATION_SPRAY | Freq: Two times a day (BID) | RESPIRATORY_TRACT | 5 refills | Status: AC

## 2024-10-02 MED ORDER — ALBUTEROL SULFATE HFA 108 (90 BASE) MCG/ACT IN AERS: 2.0000 | INHALATION_SPRAY | RESPIRATORY_TRACT | 5 refills | Status: AC | PRN

## 2024-10-02 MED ORDER — FLUTICASONE PROPIONATE 50 MCG/ACT NA SUSP: 1.0000 | Freq: Two times a day (BID) | NASAL | 2 refills | Status: AC

## 2024-10-02 MED ORDER — AZITHROMYCIN 250 MG PO TABS: ORAL_TABLET | ORAL | 0 refills | Status: DC

## 2024-10-02 MED ORDER — ALBUTEROL SULFATE (2.5 MG/3ML) 0.083% IN NEBU: 2.5000 mg | INHALATION_SOLUTION | RESPIRATORY_TRACT | 1 refills | Status: AC | PRN

## 2024-10-02 NOTE — Patient Instructions (Addendum)
 Asthma Continue Breyna  160-2 puffs twice a day to prevent cough or wheeze Continue albuterol  2 puffs every 4 hours as needed for cough or wheeze OR Instead use albuterol  0.083% solution via nebulizer one unit vial every 4 hours as needed for cough or wheeze Continue albuterol  2 puffs 5-15 minutes before activity to decrease cough or wheeze  Reflux Continue dietary and lifestyle modifications as listed below Continue omeprazole 20 mg once a day for the next 2-4 weeks. Take this medication 30-60 minutes before your first meal for best results  Chronic rhinitis Continue nasal saline rinses Continue Flonase  2 sprays in each nostril once a day for a stuffy nose Continue azelastine 2 sprays in each nostril twice a day for a runny nose Consider updating your allergy testing when you are feeling better  Acute cough Multi-factorial Continue the treatment plan for asthma, rhinitis, and reflux as listed above Begin Mucinex [phone] mg twice a day and increase fluid intake as tolerated If no improvement after azithromycin , we will move forward with chest xray  Acute sinusitis Previously diagnosed Continue azithromycin  Call the clinic if you develop a fever, if your symptoms worsen or do not improve   Call the clinic if this treatment plan is not working well for you  Follow up in 2 months or sooner if needed.

## 2024-10-02 NOTE — Progress Notes (Signed)
 [address] [place] [address] Dept: [phone]  FOLLOW UP NOTE  Patient ID: Gina Estrada, female    DOB: May 09, 1946  Age: 78 y.o. MRN: [id number] Date of Office Visit: 10/02/2024  Assessment  Chief Complaint: Cough and Follow-up (Follow up pt stated coughing has improved since last week. Tested for covid and flu neg for both.)  HPI Gina Estrada is a 78 year old female who presents to the clinic for follow-up visit.  She was last seen in this clinic on 08/28/2024 by Arlean Mutter, FNP, for evaluation of asthma, chronic rhinitis, and reflux.  In the interim, she reports that she had a viral illness over Thanksgiving.  She reports that most of her family experienced the same illness.  She reports that her symptoms that occurred near Thanksgiving had resolved.  Discussed the use of AI scribe software for clinical note transcription with the patient, who gave verbal consent to proceed.  History of Present Illness Gina Estrada is a 78 year old female who presents with persistent cough and fever.  At today's visit, she reports that on Thursday and into Friday she began to experience fever ranging from 100-103, cough, nausea, and some mild nasal congestion.  She denies vomiting.  She reports she has experienced a decreased appetite since Friday. Her fever was difficult to control, only breaking temporarily with Advil before returning. She was seen at Urgent Care on Sunday and started on azithromycin , which has helped reduce her symptoms, although she still feels weak.  She reports that testing at urgent care was negative for COVID and flu.  She reports that she was diagnosed with acute sinusitis at urgent care visit on Sunday.  She has been feeling extremely fatigued, with her legs feeling weak and 'like jelly.' She has been able to drink water and orange juice but has not been able to consume her usual tea or Pepsi.  Asthma is reported as moderately well-controlled with cough as the main  symptom.  She reports this cough occurred after Thanksgiving and resolved and reoccurred on Friday.  She reports the cough is mostly dry at this point.  She denies shortness of breath, chest tightness, chest pain, or wheezing.  She continues Breyna  inhaler 2 puffs twice a day and uses albuterol  1-2 times a week with moderate relief of cough.  Allergic rhinitis is reported as moderately well-controlled with occasional nasal congestion and frequent postnasal drainage.  She is not currently using Flonase  or nasal saline rinses.    Reflux is reported as well-controlled with no symptoms including heartburn or vomiting.  She continues omeprazole 20 mg once a day with relief of symptoms..  Her current medications are listed in the chart.  Drug Allergies:  Allergies[1]  Physical Exam: BP 122/62   Pulse 92   Temp (!) 97.3 F (36.3 C) (Temporal)   SpO2 95%    Physical Exam Vitals reviewed.  Constitutional:      Appearance: Normal appearance.  HENT:     Head: Normocephalic and atraumatic.     Right Ear: Tympanic membrane normal.     Left Ear: Tympanic membrane normal.     Nose:     Comments: Bilateral nares normal.  Pharynx slightly erythematous with no exudate.  Ears normal.  Eyes normal.    Mouth/Throat:     Pharynx: Oropharynx is clear.  Eyes:     Conjunctiva/sclera: Conjunctivae normal.  Cardiovascular:     Rate and Rhythm: Normal rate and regular rhythm.     Heart  sounds: Normal heart sounds. No murmur heard. Pulmonary:     Effort: Pulmonary effort is normal.     Comments: Lungs clear to auscultation Musculoskeletal:        General: Normal range of motion.     Cervical back: Normal range of motion and neck supple.  Skin:    General: Skin is warm and dry.     Coloration: Skin is pale.  Neurological:     Mental Status: She is alert and oriented to person, place, and time.  Psychiatric:        Mood and Affect: Mood normal.        Behavior: Behavior normal.        Thought  Content: Thought content normal.        Judgment: Judgment normal.     Diagnostics: Deferred due to recent fever  Assessment and Plan: 1. Moderate persistent asthma with acute exacerbation   2. Other allergic rhinitis   3. Gastroesophageal reflux disease, unspecified whether esophagitis present   4. Acute cough   5. Acute bacterial sinusitis     Meds ordered this encounter  Medications   albuterol  (PROVENTIL ) (2.5 MG/3ML) 0.083% nebulizer solution    Sig: Take 3 mLs (2.5 mg total) by nebulization every 4 (four) hours as needed for wheezing or shortness of breath.    Dispense:  75 mL    Refill:  1   albuterol  (VENTOLIN  HFA) 108 (90 Base) MCG/ACT inhaler    Sig: Inhale 2 puffs into the lungs every 4 (four) hours as needed for wheezing or shortness of breath.    Dispense:  1 each    Refill:  5   fluticasone  (FLONASE ) 50 MCG/ACT nasal spray    Sig: Place 1 spray into both nostrils in the morning and at bedtime.    Dispense:  16 g    Refill:  2   budesonide -formoterol  (BREYNA ) 160-4.5 MCG/ACT inhaler    Sig: Inhale 2 puffs into the lungs 2 (two) times daily.    Dispense:  10.3 g    Refill:  5    Patient Instructions  Asthma Continue Breyna  160-2 puffs twice a day to prevent cough or wheeze Continue albuterol  2 puffs every 4 hours as needed for cough or wheeze OR Instead use albuterol  0.083% solution via nebulizer one unit vial every 4 hours as needed for cough or wheeze Continue albuterol  2 puffs 5-15 minutes before activity to decrease cough or wheeze  Reflux Continue dietary and lifestyle modifications as listed below Continue omeprazole 20 mg once a day for the next 2-4 weeks. Take this medication 30-60 minutes before your first meal for best results  Chronic rhinitis Continue nasal saline rinses Continue Flonase  2 sprays in each nostril once a day for a stuffy nose Continue azelastine 2 sprays in each nostril twice a day for a runny nose Consider updating your allergy  testing when you are feeling better  Acute cough Multi-factorial Continue the treatment plan for asthma, rhinitis, and reflux as listed above Begin Mucinex [phone] mg twice a day and increase fluid intake as tolerated If no improvement after azithromycin , we will move forward with chest xray  Acute sinusitis Previously diagnosed Continue azithromycin  Call the clinic if you develop a fever, if your symptoms worsen or do not improve   Call the clinic if this treatment plan is not working well for you  Follow up in 2 months or sooner if needed.   Return in about 2 months (around 12/03/2024), or  if symptoms worsen or fail to improve.    Thank you for the opportunity to care for this patient.  Please do not hesitate to contact me with questions.  Arlean Mutter, FNP [facility]          [1]  Allergies Allergen Reactions   Aspirin Other (See Comments)    Asthma Exacerbations   Montelukast      SLEEP ISSUE   Penicillins Hives and Itching   Sulfa Antibiotics Hives and Itching   Codeine Nausea And Vomiting

## 2024-10-03 ENCOUNTER — Ambulatory Visit: Admitting: Internal Medicine
